# Patient Record
Sex: Female | Born: 1940
Health system: Southern US, Community
[De-identification: ages and names within clinical notes are randomized; demographics above are authoritative.]

## PROBLEM LIST (undated history)

## (undated) DIAGNOSIS — H409 Unspecified glaucoma: Secondary | ICD-10-CM

## (undated) DIAGNOSIS — I10 Essential (primary) hypertension: Secondary | ICD-10-CM

## (undated) DIAGNOSIS — N6310 Unspecified lump in the right breast, unspecified quadrant: Secondary | ICD-10-CM

## (undated) DIAGNOSIS — E785 Hyperlipidemia, unspecified: Secondary | ICD-10-CM

## (undated) HISTORY — PX: BREAST EXCISIONAL BIOPSY: SUR124

## (undated) HISTORY — PX: EYE SURGERY: SHX253

---

## 1997-09-16 ENCOUNTER — Ambulatory Visit (HOSPITAL_COMMUNITY): Admission: RE | Admit: 1997-09-16 | Discharge: 1997-09-16 | Payer: Self-pay | Admitting: Obstetrics and Gynecology

## 1998-01-18 ENCOUNTER — Ambulatory Visit (HOSPITAL_COMMUNITY): Admission: RE | Admit: 1998-01-18 | Discharge: 1998-01-18 | Payer: Self-pay | Admitting: Cardiovascular Disease

## 1998-02-16 ENCOUNTER — Other Ambulatory Visit: Admission: RE | Admit: 1998-02-16 | Discharge: 1998-02-16 | Payer: Self-pay | Admitting: *Deleted

## 1999-04-29 ENCOUNTER — Encounter: Payer: Self-pay | Admitting: Obstetrics and Gynecology

## 1999-04-29 ENCOUNTER — Encounter: Admission: RE | Admit: 1999-04-29 | Discharge: 1999-04-29 | Payer: Self-pay | Admitting: Obstetrics and Gynecology

## 1999-11-27 ENCOUNTER — Encounter: Payer: Self-pay | Admitting: Otolaryngology

## 1999-11-27 ENCOUNTER — Encounter: Admission: RE | Admit: 1999-11-27 | Discharge: 1999-11-27 | Payer: Self-pay | Admitting: Otolaryngology

## 2000-02-07 ENCOUNTER — Encounter: Payer: Self-pay | Admitting: Otolaryngology

## 2000-02-07 ENCOUNTER — Encounter: Admission: RE | Admit: 2000-02-07 | Discharge: 2000-02-07 | Payer: Self-pay | Admitting: Otolaryngology

## 2000-05-22 ENCOUNTER — Encounter: Payer: Self-pay | Admitting: Internal Medicine

## 2000-05-22 ENCOUNTER — Encounter: Admission: RE | Admit: 2000-05-22 | Discharge: 2000-05-22 | Payer: Self-pay | Admitting: Internal Medicine

## 2001-05-27 ENCOUNTER — Encounter: Payer: Self-pay | Admitting: Internal Medicine

## 2001-05-27 ENCOUNTER — Encounter: Admission: RE | Admit: 2001-05-27 | Discharge: 2001-05-27 | Payer: Self-pay | Admitting: Internal Medicine

## 2001-10-12 ENCOUNTER — Other Ambulatory Visit: Admission: RE | Admit: 2001-10-12 | Discharge: 2001-10-12 | Payer: Self-pay | Admitting: *Deleted

## 2002-06-30 ENCOUNTER — Encounter: Admission: RE | Admit: 2002-06-30 | Discharge: 2002-06-30 | Payer: Self-pay | Admitting: Internal Medicine

## 2002-06-30 ENCOUNTER — Encounter: Payer: Self-pay | Admitting: Internal Medicine

## 2002-07-13 ENCOUNTER — Other Ambulatory Visit: Admission: RE | Admit: 2002-07-13 | Discharge: 2002-07-13 | Payer: Self-pay | Admitting: Internal Medicine

## 2003-07-05 ENCOUNTER — Encounter: Admission: RE | Admit: 2003-07-05 | Discharge: 2003-07-05 | Payer: Self-pay | Admitting: Internal Medicine

## 2004-03-24 ENCOUNTER — Emergency Department (HOSPITAL_COMMUNITY): Admission: EM | Admit: 2004-03-24 | Discharge: 2004-03-24 | Payer: Self-pay | Admitting: Emergency Medicine

## 2004-06-05 ENCOUNTER — Ambulatory Visit (HOSPITAL_COMMUNITY): Admission: RE | Admit: 2004-06-05 | Discharge: 2004-06-05 | Payer: Self-pay | Admitting: Gastroenterology

## 2004-10-09 ENCOUNTER — Other Ambulatory Visit: Admission: RE | Admit: 2004-10-09 | Discharge: 2004-10-09 | Payer: Self-pay | Admitting: Internal Medicine

## 2005-08-04 ENCOUNTER — Encounter: Admission: RE | Admit: 2005-08-04 | Discharge: 2005-08-04 | Payer: Self-pay | Admitting: Internal Medicine

## 2005-10-05 ENCOUNTER — Emergency Department (HOSPITAL_COMMUNITY): Admission: EM | Admit: 2005-10-05 | Discharge: 2005-10-05 | Payer: Self-pay | Admitting: Emergency Medicine

## 2006-08-10 ENCOUNTER — Encounter: Admission: RE | Admit: 2006-08-10 | Discharge: 2006-08-10 | Payer: Self-pay | Admitting: Internal Medicine

## 2006-10-22 ENCOUNTER — Other Ambulatory Visit: Admission: RE | Admit: 2006-10-22 | Discharge: 2006-10-22 | Payer: Self-pay | Admitting: Internal Medicine

## 2008-02-11 ENCOUNTER — Encounter: Admission: RE | Admit: 2008-02-11 | Discharge: 2008-02-11 | Payer: Self-pay | Admitting: Internal Medicine

## 2008-10-17 ENCOUNTER — Other Ambulatory Visit: Admission: RE | Admit: 2008-10-17 | Discharge: 2008-10-17 | Payer: Self-pay | Admitting: Internal Medicine

## 2009-04-26 ENCOUNTER — Encounter: Admission: RE | Admit: 2009-04-26 | Discharge: 2009-04-26 | Payer: Self-pay | Admitting: Internal Medicine

## 2009-10-17 ENCOUNTER — Encounter: Admission: RE | Admit: 2009-10-17 | Discharge: 2009-10-17 | Payer: Self-pay | Admitting: Internal Medicine

## 2010-01-11 ENCOUNTER — Encounter (INDEPENDENT_AMBULATORY_CARE_PROVIDER_SITE_OTHER): Payer: Self-pay | Admitting: Internal Medicine

## 2010-01-11 ENCOUNTER — Ambulatory Visit: Payer: Self-pay | Admitting: Surgery

## 2010-01-11 ENCOUNTER — Ambulatory Visit (HOSPITAL_COMMUNITY): Admission: RE | Admit: 2010-01-11 | Discharge: 2010-01-11 | Payer: Self-pay | Admitting: Internal Medicine

## 2010-07-19 NOTE — Op Note (Signed)
NAME:  Tami Mercado, QAZI NO.:  [id number]   MEDICAL RECORD NO.:  [id number]          PATIENT TYPE:  AMB   LOCATION:  ENDO                         FACILITY:  [facility]   PHYSICIAN:  Danise Edge, M.D.   DATE OF BIRTH:  01/14/1943   DATE OF PROCEDURE:  06/05/2004  DATE OF DISCHARGE:                                 OPERATIVE REPORT   PROCEDURE INDICATION:  [name] is a 70 year old female, born  January 14, 1943. Ms. Galeno had an episode of hematochezia associated  with constipation. She is scheduled to undergo a diagnostic colonoscopy.   ENDOSCOPIST:  Danise Edge, M.D.   PREMEDICATION:  Versed 5 mg , Demerol 50 mg.   PROCEDURE:  After obtaining informed consent, [name] was placed in the  left lateral decubitus position. I administered intravenous Demerol and  intravenous Versed to achieve conscious sedation for the procedure. The  patient's blood pressure, oxygen saturation and cardiac rhythm were  monitored throughout the procedure and documented in the medical record.   Anal inspection and digital rectal exam were normal. The Olympus adjustable  pediatric colonoscope was introduced into the rectum and advanced to the  cecum. The appendiceal orifice and ileocecal valve were identified. The  ileocecal valve was intubated and the distal ileum inspected. Colonic  preparation for the exam today was excellent.   Rectum normal. There was not enough room to perform a retroflexed view of  the distal rectum. There appears to be moderate sized internal hemorrhoids  in the distal rectum.  Sigmoid colon and descending colon normal.  Splenic flexure normal.  Transverse colon normal.  Hepatic flexure normal.  Ascending colon normal.  The cecum and ileocecal valve normal.  Distal ileum normal.   ASSESSMENT:  Normal diagnostic proctocolonoscopy to the cecum with  inspection of the distal ileum.      MJ/MEDQ  D:  06/05/2004  T:  06/05/2004  Job:   [id number]   cc:   Georgann Housekeeper, MD  [address]  [place]  [place] [address]  Fax: [phone]

## 2010-12-25 ENCOUNTER — Other Ambulatory Visit: Payer: Self-pay | Admitting: Internal Medicine

## 2010-12-25 DIAGNOSIS — Z1231 Encounter for screening mammogram for malignant neoplasm of breast: Secondary | ICD-10-CM

## 2010-12-30 ENCOUNTER — Ambulatory Visit
Admission: RE | Admit: 2010-12-30 | Discharge: 2010-12-30 | Disposition: A | Discharge status: Home / Self Care | Payer: BC Managed Care – PPO | Source: Ambulatory Visit | Attending: Internal Medicine | Admitting: Internal Medicine

## 2010-12-30 DIAGNOSIS — Z1231 Encounter for screening mammogram for malignant neoplasm of breast: Secondary | ICD-10-CM

## 2011-01-02 ENCOUNTER — Ambulatory Visit: Payer: Self-pay

## 2011-01-08 ENCOUNTER — Other Ambulatory Visit (HOSPITAL_COMMUNITY)
Admission: RE | Admit: 2011-01-08 | Discharge: 2011-01-08 | Disposition: A | Discharge status: Home / Self Care | Payer: BC Managed Care – PPO | Source: Ambulatory Visit | Attending: Obstetrics and Gynecology | Admitting: Obstetrics and Gynecology

## 2011-01-08 ENCOUNTER — Other Ambulatory Visit: Payer: Self-pay | Admitting: Obstetrics and Gynecology

## 2011-01-08 DIAGNOSIS — Z01419 Encounter for gynecological examination (general) (routine) without abnormal findings: Secondary | ICD-10-CM | POA: Insufficient documentation

## 2012-02-09 ENCOUNTER — Other Ambulatory Visit: Payer: Self-pay | Admitting: Internal Medicine

## 2012-02-09 DIAGNOSIS — Z1231 Encounter for screening mammogram for malignant neoplasm of breast: Secondary | ICD-10-CM

## 2012-03-17 ENCOUNTER — Ambulatory Visit
Admission: RE | Admit: 2012-03-17 | Discharge: 2012-03-17 | Disposition: A | Discharge status: Home / Self Care | Payer: BC Managed Care – PPO | Source: Ambulatory Visit | Attending: Internal Medicine | Admitting: Internal Medicine

## 2012-03-17 DIAGNOSIS — Z1231 Encounter for screening mammogram for malignant neoplasm of breast: Secondary | ICD-10-CM

## 2013-05-24 ENCOUNTER — Other Ambulatory Visit: Payer: Self-pay

## 2013-05-24 DIAGNOSIS — Z1231 Encounter for screening mammogram for malignant neoplasm of breast: Secondary | ICD-10-CM

## 2013-06-10 ENCOUNTER — Ambulatory Visit: Payer: BC Managed Care – PPO

## 2013-06-28 ENCOUNTER — Encounter (INDEPENDENT_AMBULATORY_CARE_PROVIDER_SITE_OTHER): Payer: Self-pay

## 2013-06-28 ENCOUNTER — Ambulatory Visit
Admission: RE | Admit: 2013-06-28 | Discharge: 2013-06-28 | Disposition: A | Discharge status: Home / Self Care | Payer: BC Managed Care – PPO | Source: Ambulatory Visit

## 2013-06-28 DIAGNOSIS — Z1231 Encounter for screening mammogram for malignant neoplasm of breast: Secondary | ICD-10-CM

## 2013-07-05 ENCOUNTER — Ambulatory Visit (HOSPITAL_COMMUNITY): Payer: BC Managed Care – PPO | Attending: Cardiology | Admitting: Cardiology

## 2013-07-05 ENCOUNTER — Other Ambulatory Visit (HOSPITAL_COMMUNITY): Payer: Self-pay | Admitting: Internal Medicine

## 2013-07-05 DIAGNOSIS — R011 Cardiac murmur, unspecified: Secondary | ICD-10-CM | POA: Insufficient documentation

## 2013-07-05 DIAGNOSIS — E785 Hyperlipidemia, unspecified: Secondary | ICD-10-CM | POA: Insufficient documentation

## 2013-07-05 DIAGNOSIS — I10 Essential (primary) hypertension: Secondary | ICD-10-CM | POA: Insufficient documentation

## 2013-07-05 NOTE — Progress Notes (Signed)
Echo performed. 

## 2015-02-07 ENCOUNTER — Other Ambulatory Visit: Payer: Self-pay

## 2015-02-07 DIAGNOSIS — Z1231 Encounter for screening mammogram for malignant neoplasm of breast: Secondary | ICD-10-CM

## 2015-02-15 ENCOUNTER — Other Ambulatory Visit: Payer: Self-pay | Admitting: Gastroenterology

## 2015-02-15 ENCOUNTER — Encounter (HOSPITAL_COMMUNITY): Payer: Self-pay | Admitting: *Deleted

## 2015-03-01 ENCOUNTER — Encounter (HOSPITAL_COMMUNITY): Payer: Self-pay

## 2015-03-01 ENCOUNTER — Ambulatory Visit (HOSPITAL_COMMUNITY)
Admission: RE | Admit: 2015-03-01 | Discharge: 2015-03-01 | Disposition: A | Discharge status: Home / Self Care | Payer: BLUE CROSS/BLUE SHIELD | Source: Ambulatory Visit | Attending: Gastroenterology | Admitting: Gastroenterology

## 2015-03-01 ENCOUNTER — Ambulatory Visit (HOSPITAL_COMMUNITY): Payer: BLUE CROSS/BLUE SHIELD | Admitting: Anesthesiology

## 2015-03-01 ENCOUNTER — Encounter (HOSPITAL_COMMUNITY)
Admission: RE | Disposition: A | Discharge status: Home / Self Care | Payer: Self-pay | Source: Ambulatory Visit | Attending: Gastroenterology

## 2015-03-01 DIAGNOSIS — K635 Polyp of colon: Secondary | ICD-10-CM | POA: Diagnosis not present

## 2015-03-01 DIAGNOSIS — I340 Nonrheumatic mitral (valve) insufficiency: Secondary | ICD-10-CM | POA: Insufficient documentation

## 2015-03-01 DIAGNOSIS — I10 Essential (primary) hypertension: Secondary | ICD-10-CM | POA: Diagnosis not present

## 2015-03-01 DIAGNOSIS — E7800 Pure hypercholesterolemia, unspecified: Secondary | ICD-10-CM | POA: Diagnosis not present

## 2015-03-01 DIAGNOSIS — Z1211 Encounter for screening for malignant neoplasm of colon: Secondary | ICD-10-CM | POA: Diagnosis present

## 2015-03-01 DIAGNOSIS — H409 Unspecified glaucoma: Secondary | ICD-10-CM | POA: Diagnosis not present

## 2015-03-01 DIAGNOSIS — M1990 Unspecified osteoarthritis, unspecified site: Secondary | ICD-10-CM | POA: Diagnosis not present

## 2015-03-01 HISTORY — PX: COLONOSCOPY WITH PROPOFOL: SHX5780

## 2015-03-01 HISTORY — DX: Essential (primary) hypertension: I10

## 2015-03-01 SURGERY — COLONOSCOPY WITH PROPOFOL
Anesthesia: Monitor Anesthesia Care

## 2015-03-01 MED ORDER — LACTATED RINGERS IV SOLN
INTRAVENOUS | Status: DC
  Administered 2015-03-01: 1000 mL via INTRAVENOUS

## 2015-03-01 MED ORDER — PROPOFOL 10 MG/ML IV BOLUS
INTRAVENOUS | Status: DC | PRN
  Administered 2015-03-01: 100 mg via INTRAVENOUS
  Administered 2015-03-01: 50 mg via INTRAVENOUS

## 2015-03-01 MED ORDER — SODIUM CHLORIDE 0.9 % IV SOLN
INTRAVENOUS | Status: DC
Start: 2015-03-01 — End: 2015-03-01

## 2015-03-01 MED ORDER — PROPOFOL 10 MG/ML IV BOLUS
INTRAVENOUS | Status: AC
  Filled 2015-03-01: qty 20

## 2015-03-01 SURGICAL SUPPLY — 22 items

## 2015-03-01 NOTE — Anesthesia Postprocedure Evaluation (Signed)
Anesthesia Post Note  Patient: Tami DownsDiane C Mercado  Procedure(s) Performed: Procedure(s) (LRB): COLONOSCOPY WITH PROPOFOL (N/A)  Patient location during evaluation: Endoscopy Anesthesia Type: MAC Level of consciousness: awake and alert Pain management: pain level controlled Vital Signs Assessment: post-procedure vital signs reviewed and stable Respiratory status: spontaneous breathing, nonlabored ventilation, respiratory function stable and patient connected to nasal cannula oxygen Cardiovascular status: stable and blood pressure returned to baseline Anesthetic complications: no    Last Vitals:  Filed Vitals:   03/01/15 1240 03/01/15 1250  BP: 133/52 131/47  Pulse: 66 62  Temp:    Resp: 18 20    Last Pain: There were no vitals filed for this visit.               Phillips Groutarignan, Archibald Marchetta

## 2015-03-01 NOTE — Discharge Instructions (Signed)
Colonoscopy, Care After °Refer to this sheet in the next few weeks. These instructions provide you with information on caring for yourself after your procedure. Your health care provider may also give you more specific instructions. Your treatment has been planned according to current medical practices, but problems sometimes occur. Call your health care provider if you have any problems or questions after your procedure. °WHAT TO EXPECT AFTER THE PROCEDURE  °After your procedure, it is typical to have the following: °· A small amount of blood in your stool. °· Moderate amounts of gas and mild abdominal cramping or bloating. °HOME CARE INSTRUCTIONS °· Do not drive, operate machinery, or sign important documents for 24 hours. °· You may shower and resume your regular physical activities, but move at a slower pace for the first 24 hours. °· Take frequent rest periods for the first 24 hours. °· Walk around or put a warm pack on your abdomen to help reduce abdominal cramping and bloating. °· Drink enough fluids to keep your urine clear or pale yellow. °· You may resume your normal diet as instructed by your health care provider. Avoid heavy or fried foods that are hard to digest. °· Avoid drinking alcohol for 24 hours or as instructed by your health care provider. °· Only take over-the-counter or prescription medicines as directed by your health care provider. °SEEK MEDICAL CARE IF: °You have persistent spotting of blood in your stool 2-3 days after the procedure. °SEEK IMMEDIATE MEDICAL CARE IF: °· You have more than a small spotting of blood in your stool. °· You pass large blood clots in your stool. °· Your abdomen is swollen (distended). °· You have nausea or vomiting. °· You have a fever. °· You have increasing abdominal pain that is not relieved with medicine. °  °This information is not intended to replace advice given to you by your health care provider. Make sure you discuss any questions you have with your  health care provider. °  °Document Released: 10/02/2003 Document Revised: 12/08/2012 Document Reviewed: 10/25/2012 °Elsevier Interactive Patient Education ©2016 [employer] ° °

## 2015-03-01 NOTE — Anesthesia Preprocedure Evaluation (Signed)
Anesthesia Evaluation  Patient identified by MRN, date of birth, ID band Patient awake    Reviewed: Allergy & Precautions, NPO status , Patient's Chart, lab work & pertinent test results  Airway Mallampati: II  TM Distance: >3 FB Neck ROM: Full    Dental no notable dental hx.    Pulmonary neg pulmonary ROS,    Pulmonary exam normal breath sounds clear to auscultation       Cardiovascular hypertension, Pt. on medications Normal cardiovascular exam Rhythm:Regular Rate:Normal     Neuro/Psych negative neurological ROS  negative psych ROS   GI/Hepatic negative GI ROS, Neg liver ROS,   Endo/Other  negative endocrine ROS  Renal/GU negative Renal ROS  negative genitourinary   Musculoskeletal negative musculoskeletal ROS (+)   Abdominal   Peds negative pediatric ROS (+)  Hematology negative hematology ROS (+)   Anesthesia Other Findings   Reproductive/Obstetrics negative OB ROS                             Anesthesia Physical Anesthesia Plan  ASA: II  Anesthesia Plan: MAC   Post-op Pain Management:    Induction:   Airway Management Planned: Simple Face Mask  Additional Equipment:   Intra-op Plan:   Post-operative Plan:   Informed Consent: I have reviewed the patients History and Physical, chart, labs and discussed the procedure including the risks, benefits and alternatives for the proposed anesthesia with the patient or authorized representative who has indicated his/her understanding and acceptance.   Dental advisory given  Plan Discussed with: CRNA  Anesthesia Plan Comments:         Anesthesia Quick Evaluation  

## 2015-03-01 NOTE — Transfer of Care (Signed)
Immediate Anesthesia Transfer of Care Note  Patient: Tami Mercado  Procedure(s) Performed: Procedure(s): COLONOSCOPY WITH PROPOFOL (N/A)  Patient Location: PACU  Anesthesia Type:MAC  Level of Consciousness: awake, alert , oriented and patient cooperative  Airway & Oxygen Therapy: Patient Spontanous Breathing  Post-op Assessment: Report given to RN, Post -op Vital signs reviewed and stable and Patient moving all extremities X 4  Post vital signs: Reviewed and stable  Last Vitals:  Filed Vitals:   03/01/15 1111  BP: 146/62  Pulse: 69  Temp: 36.6 C  Resp: 21    Complications: No apparent anesthesia complications

## 2015-03-01 NOTE — Op Note (Signed)
Procedure: Screening colonoscopy. Normal screening colonoscopy performed on 06/05/2004  Endoscopist: Danise EdgeMartin Kinsey Karch  Premedication: Propofol administered by anesthesia  Procedure: The patient was placed in the left lateral decubitus position. Anal inspection and digital rectal exam were normal. The Pentax pediatric colonoscope was introduced into the rectum and advanced to the cecum. A normal-appearing appendiceal orifice was identified. A normal-appearing ileocecal valve was intubated and the terminal ileum inspected. Colonic preparation exam today was good. Withdrawal time was 10 minutes  Rectum. Normal. Retroflexed view of the distal rectum was normal  Sigmoid colon and descending colon. From the distal sigmoid colon, a 5 mm sessile polyp was removed with the cold snare  Splenic flexure. Normal  Transverse colon. Normal  Hepatic flexure. Normal  Ascending colon. Normal  Cecum and ileocecal valve. Normal  Terminal ileum. Normal.  Assessment: A small polyp was removed from the distal sigmoid colon. Otherwise normal colonoscopy  Recommendation: I do not recommend performing repeat surveillance colonoscopy

## 2015-03-02 ENCOUNTER — Encounter (HOSPITAL_COMMUNITY): Payer: Self-pay | Admitting: Gastroenterology

## 2015-03-14 ENCOUNTER — Ambulatory Visit
Admission: RE | Admit: 2015-03-14 | Discharge: 2015-03-14 | Disposition: A | Discharge status: Home / Self Care | Payer: BLUE CROSS/BLUE SHIELD | Source: Ambulatory Visit

## 2015-03-14 DIAGNOSIS — Z1231 Encounter for screening mammogram for malignant neoplasm of breast: Secondary | ICD-10-CM

## 2015-05-01 NOTE — H&P (Signed)
Procedure: Screening colonoscopy. Normal screening colonoscopy performed on 06/05/2004  History: The patient is a 75 year old female born 01/14/1943. She is scheduled to undergo a screening colonoscopy today.  Past medical history: Hypercholesterolemia. Hypertension. Osteoporosis. Osteoarthritis. Gastroesophageal reflux. Glaucoma. Mitral valve regurgitation. Chronic anxiety. Right shoulder arthroscopy. Left eye cataract surgery. Right foot surgery.  Medication allergies: Fosamax. Lisinopril. Dicyclomine.  Exam: The patient is alert and lying comfortably on the endoscopy stretcher. Abdomen is soft and nontender to palpation. Lungs are clear to auscultation. Cardiac exam reveals a regular rhythm.  Plan: Proceed with screening colonoscopy

## 2016-01-30 ENCOUNTER — Encounter: Payer: Self-pay | Admitting: Podiatry

## 2016-01-30 ENCOUNTER — Ambulatory Visit (INDEPENDENT_AMBULATORY_CARE_PROVIDER_SITE_OTHER): Payer: BLUE CROSS/BLUE SHIELD | Admitting: Podiatry

## 2016-01-30 ENCOUNTER — Ambulatory Visit (INDEPENDENT_AMBULATORY_CARE_PROVIDER_SITE_OTHER): Payer: BLUE CROSS/BLUE SHIELD

## 2016-01-30 DIAGNOSIS — L84 Corns and callosities: Secondary | ICD-10-CM

## 2016-01-30 DIAGNOSIS — M79671 Pain in right foot: Secondary | ICD-10-CM | POA: Diagnosis not present

## 2016-01-30 DIAGNOSIS — M779 Enthesopathy, unspecified: Secondary | ICD-10-CM

## 2016-01-30 DIAGNOSIS — M2041 Other hammer toe(s) (acquired), right foot: Secondary | ICD-10-CM

## 2016-01-30 MED ORDER — TRIAMCINOLONE ACETONIDE 10 MG/ML IJ SUSP
10.0000 mg | Freq: Once | INTRAMUSCULAR | Status: AC
  Administered 2016-01-30: 10 mg

## 2016-01-30 NOTE — Progress Notes (Signed)
   Subjective:    Patient ID: Tami Mercado Surgeon, female    DOB: 01/14/1943, 75 y.o.   MRN: [id number]  HPI Chief Complaint  Patient presents with  . Callouses    Right foot; plantar forefoot; x3 weeks      Review of Systems  All other systems reviewed and are negative.      Objective:   Physical Exam        Assessment & Plan:

## 2016-01-31 NOTE — Progress Notes (Signed)
Subjective:     Patient ID: Tami Mercado, female   DOB: 01/14/1943, 75 y.o.   MRN: [id number]  HPI patient presents with painful callus underneath the right second metatarsal with large keratotic lesion formation and states it's been occurring for about 2 months with mild movement of the second toe also noted   Review of Systems  All other systems reviewed and are negative.      Objective:   Physical Exam  Constitutional: She is oriented to person, place, and time.  Cardiovascular: Intact distal pulses.   Musculoskeletal: Normal range of motion.  Neurological: She is oriented to person, place, and time.  Skin: Skin is warm.  Nursing note and vitals reviewed.  neurovascular status intact muscle strength adequate range of motion within normal limits with patient found to have discomfort and fluid buildup around the second MPJ right with mild medial and slight dorsal dislocation second digit with keratotic lesion formation that is around the second metatarsal head right. Patient's found to have good digital perfusion and is well oriented 3     Assessment:     Probability for mild flexor plate stretch with movement of the second toe with inflammatory capsulitis second MPJ and keratotic lesion secondary to pressure against the metatarsal    Plan:     H&P x-rays reviewed and today I did a proximal nerve block explained risk of injection and carefully aspirated the second MPJ getting out of small amount of clear fluid. Then injected the joint with a quarter cc dexamethasone Kenalog and debrided lesion to take pressure off and reappoint 2 weeks and advised on not going barefoot and wearing rigid bottom shoes. Reappoint at that time  X-ray indicates mild drift of the second digit right and a medial direction with no indications of arthritis or stress fracture

## 2016-03-14 ENCOUNTER — Other Ambulatory Visit: Payer: Self-pay | Admitting: Internal Medicine

## 2016-03-14 DIAGNOSIS — Z1231 Encounter for screening mammogram for malignant neoplasm of breast: Secondary | ICD-10-CM

## 2016-04-04 ENCOUNTER — Ambulatory Visit
Admission: RE | Admit: 2016-04-04 | Discharge: 2016-04-04 | Disposition: A | Discharge status: Home / Self Care | Payer: BLUE CROSS/BLUE SHIELD | Source: Ambulatory Visit | Attending: Internal Medicine | Admitting: Internal Medicine

## 2016-04-04 DIAGNOSIS — Z1231 Encounter for screening mammogram for malignant neoplasm of breast: Secondary | ICD-10-CM

## 2016-04-07 ENCOUNTER — Other Ambulatory Visit: Payer: Self-pay | Admitting: Internal Medicine

## 2016-04-07 DIAGNOSIS — R928 Other abnormal and inconclusive findings on diagnostic imaging of breast: Secondary | ICD-10-CM

## 2016-04-11 ENCOUNTER — Ambulatory Visit
Admission: RE | Admit: 2016-04-11 | Discharge: 2016-04-11 | Disposition: A | Discharge status: Home / Self Care | Payer: BLUE CROSS/BLUE SHIELD | Source: Ambulatory Visit | Attending: Internal Medicine | Admitting: Internal Medicine

## 2016-04-11 ENCOUNTER — Other Ambulatory Visit: Payer: Self-pay | Admitting: Internal Medicine

## 2016-04-11 DIAGNOSIS — R928 Other abnormal and inconclusive findings on diagnostic imaging of breast: Secondary | ICD-10-CM

## 2016-04-11 DIAGNOSIS — N6489 Other specified disorders of breast: Secondary | ICD-10-CM

## 2016-04-16 ENCOUNTER — Ambulatory Visit
Admission: RE | Admit: 2016-04-16 | Discharge: 2016-04-16 | Disposition: A | Discharge status: Home / Self Care | Payer: BLUE CROSS/BLUE SHIELD | Source: Ambulatory Visit | Attending: Internal Medicine | Admitting: Internal Medicine

## 2016-04-16 ENCOUNTER — Other Ambulatory Visit: Payer: Self-pay | Admitting: Internal Medicine

## 2016-04-16 DIAGNOSIS — R928 Other abnormal and inconclusive findings on diagnostic imaging of breast: Secondary | ICD-10-CM

## 2016-04-16 DIAGNOSIS — N6489 Other specified disorders of breast: Secondary | ICD-10-CM

## 2016-04-30 ENCOUNTER — Ambulatory Visit: Payer: Self-pay | Admitting: General Surgery

## 2016-04-30 DIAGNOSIS — N6091 Unspecified benign mammary dysplasia of right breast: Secondary | ICD-10-CM

## 2016-05-16 ENCOUNTER — Other Ambulatory Visit: Payer: Self-pay | Admitting: General Surgery

## 2016-05-16 DIAGNOSIS — N6091 Unspecified benign mammary dysplasia of right breast: Secondary | ICD-10-CM

## 2016-06-04 ENCOUNTER — Encounter (HOSPITAL_BASED_OUTPATIENT_CLINIC_OR_DEPARTMENT_OTHER): Payer: Self-pay | Admitting: *Deleted

## 2016-06-05 ENCOUNTER — Encounter (HOSPITAL_BASED_OUTPATIENT_CLINIC_OR_DEPARTMENT_OTHER)
Admission: RE | Admit: 2016-06-05 | Discharge: 2016-06-05 | Disposition: A | Discharge status: Home / Self Care | Payer: BLUE CROSS/BLUE SHIELD | Source: Ambulatory Visit | Attending: General Surgery | Admitting: General Surgery

## 2016-06-05 DIAGNOSIS — Z01818 Encounter for other preprocedural examination: Secondary | ICD-10-CM | POA: Diagnosis present

## 2016-06-05 LAB — BASIC METABOLIC PANEL
ANION GAP: 5 (ref 5–15)
BUN: 21 mg/dL — ABNORMAL HIGH (ref 6–20)
CHLORIDE: 106 mmol/L (ref 101–111)
CO2: 31 mmol/L (ref 22–32)
Calcium: 9.3 mg/dL (ref 8.9–10.3)
Creatinine, Ser: 0.7 mg/dL (ref 0.44–1.00)
GFR calc non Af Amer: >60 mL/min (ref >60)
GLUCOSE: 162 mg/dL — AB (ref 65–99)
POTASSIUM: 4.9 mmol/L (ref 3.5–5.1)
Sodium: 142 mmol/L (ref 135–145)

## 2016-06-05 NOTE — Progress Notes (Addendum)
Boost drink given with instructions to complete by [facility], pt verbalized understanding.  EKG reviewed by Dr. Maple Hudson, will proceed with surgery as scheduled.

## 2016-06-06 NOTE — Progress Notes (Signed)
Reviewed glucose (162) result with Dr Hyacinth Meeker (anesthesia) who said OK to proceed with scheduled surgery.

## 2016-06-09 ENCOUNTER — Ambulatory Visit
Admission: RE | Admit: 2016-06-09 | Discharge: 2016-06-09 | Disposition: A | Discharge status: Home / Self Care | Payer: BLUE CROSS/BLUE SHIELD | Source: Ambulatory Visit | Attending: General Surgery | Admitting: General Surgery

## 2016-06-09 DIAGNOSIS — N6091 Unspecified benign mammary dysplasia of right breast: Secondary | ICD-10-CM

## 2016-06-11 ENCOUNTER — Ambulatory Visit
Admission: RE | Admit: 2016-06-11 | Discharge: 2016-06-11 | Disposition: A | Discharge status: Home / Self Care | Payer: BLUE CROSS/BLUE SHIELD | Source: Ambulatory Visit | Attending: General Surgery | Admitting: General Surgery

## 2016-06-11 ENCOUNTER — Ambulatory Visit (HOSPITAL_BASED_OUTPATIENT_CLINIC_OR_DEPARTMENT_OTHER)
Admission: RE | Admit: 2016-06-11 | Discharge: 2016-06-11 | Disposition: A | Discharge status: Home / Self Care | Payer: BLUE CROSS/BLUE SHIELD | Source: Ambulatory Visit | Attending: General Surgery | Admitting: General Surgery

## 2016-06-11 ENCOUNTER — Encounter (HOSPITAL_BASED_OUTPATIENT_CLINIC_OR_DEPARTMENT_OTHER)
Admission: RE | Disposition: A | Discharge status: Home / Self Care | Payer: Self-pay | Source: Ambulatory Visit | Attending: General Surgery

## 2016-06-11 ENCOUNTER — Ambulatory Visit (HOSPITAL_BASED_OUTPATIENT_CLINIC_OR_DEPARTMENT_OTHER): Payer: BLUE CROSS/BLUE SHIELD | Admitting: Anesthesiology

## 2016-06-11 ENCOUNTER — Encounter (HOSPITAL_BASED_OUTPATIENT_CLINIC_OR_DEPARTMENT_OTHER): Payer: Self-pay | Admitting: *Deleted

## 2016-06-11 DIAGNOSIS — D0501 Lobular carcinoma in situ of right breast: Secondary | ICD-10-CM | POA: Insufficient documentation

## 2016-06-11 DIAGNOSIS — N6021 Fibroadenosis of right breast: Secondary | ICD-10-CM | POA: Insufficient documentation

## 2016-06-11 DIAGNOSIS — Z79899 Other long term (current) drug therapy: Secondary | ICD-10-CM | POA: Diagnosis not present

## 2016-06-11 DIAGNOSIS — N6091 Unspecified benign mammary dysplasia of right breast: Secondary | ICD-10-CM | POA: Diagnosis present

## 2016-06-11 DIAGNOSIS — I10 Essential (primary) hypertension: Secondary | ICD-10-CM | POA: Diagnosis not present

## 2016-06-11 DIAGNOSIS — N6489 Other specified disorders of breast: Secondary | ICD-10-CM | POA: Insufficient documentation

## 2016-06-11 HISTORY — DX: Unspecified glaucoma: H40.9

## 2016-06-11 HISTORY — DX: Hyperlipidemia, unspecified: E78.5

## 2016-06-11 HISTORY — DX: Unspecified lump in the right breast, unspecified quadrant: N63.10

## 2016-06-11 HISTORY — PX: BREAST LUMPECTOMY WITH RADIOACTIVE SEED LOCALIZATION: SHX6424

## 2016-06-11 SURGERY — BREAST LUMPECTOMY WITH RADIOACTIVE SEED LOCALIZATION
Anesthesia: General | Site: Breast | Laterality: Right

## 2016-06-11 MED ORDER — ONDANSETRON HCL 4 MG/2ML IJ SOLN
INTRAMUSCULAR | Status: AC
  Filled 2016-06-11: qty 2

## 2016-06-11 MED ORDER — ONDANSETRON HCL 4 MG/2ML IJ SOLN
INTRAMUSCULAR | Status: DC | PRN
  Administered 2016-06-11: 4 mg via INTRAVENOUS

## 2016-06-11 MED ORDER — GABAPENTIN 300 MG PO CAPS
ORAL_CAPSULE | ORAL | Status: AC
  Filled 2016-06-11: qty 1

## 2016-06-11 MED ORDER — SODIUM CHLORIDE 0.9 % IJ SOLN
INTRAMUSCULAR | Status: AC
  Filled 2016-06-11: qty 10

## 2016-06-11 MED ORDER — EPHEDRINE 5 MG/ML INJ
INTRAVENOUS | Status: AC
  Filled 2016-06-11: qty 10

## 2016-06-11 MED ORDER — ESMOLOL HCL 100 MG/10ML IV SOLN
INTRAVENOUS | Status: AC
  Filled 2016-06-11: qty 10

## 2016-06-11 MED ORDER — CHLORHEXIDINE GLUCONATE CLOTH 2 % EX PADS: 6.0000 | MEDICATED_PAD | Freq: Once | CUTANEOUS | Status: DC

## 2016-06-11 MED ORDER — EPHEDRINE SULFATE 50 MG/ML IJ SOLN
INTRAMUSCULAR | Status: DC | PRN
  Administered 2016-06-11 (×2): 10 mg via INTRAVENOUS

## 2016-06-11 MED ORDER — METHYLENE BLUE 0.5 % INJ SOLN
INTRAVENOUS | Status: AC
  Filled 2016-06-11: qty 10

## 2016-06-11 MED ORDER — ACETAMINOPHEN 500 MG PO TABS
1000.0000 mg | ORAL_TABLET | ORAL | Status: AC
  Administered 2016-06-11: 1000 mg via ORAL

## 2016-06-11 MED ORDER — PROPOFOL 10 MG/ML IV BOLUS
INTRAVENOUS | Status: DC | PRN
  Administered 2016-06-11: 120 mg via INTRAVENOUS

## 2016-06-11 MED ORDER — HYDROCODONE-ACETAMINOPHEN 5-325 MG PO TABS: 1.0000 | ORAL_TABLET | ORAL | 0 refills | Status: DC | PRN

## 2016-06-11 MED ORDER — ONDANSETRON HCL 4 MG/2ML IJ SOLN: 4.0000 mg | Freq: Four times a day (QID) | INTRAMUSCULAR | Status: DC | PRN

## 2016-06-11 MED ORDER — DEXAMETHASONE SODIUM PHOSPHATE 4 MG/ML IJ SOLN
INTRAMUSCULAR | Status: DC | PRN
  Administered 2016-06-11: 10 mg via INTRAVENOUS

## 2016-06-11 MED ORDER — DEXAMETHASONE SODIUM PHOSPHATE 10 MG/ML IJ SOLN
INTRAMUSCULAR | Status: AC
  Filled 2016-06-11: qty 1

## 2016-06-11 MED ORDER — ACETAMINOPHEN 500 MG PO TABS
ORAL_TABLET | ORAL | Status: AC
  Filled 2016-06-11: qty 2

## 2016-06-11 MED ORDER — FENTANYL CITRATE (PF) 100 MCG/2ML IJ SOLN: 25.0000 ug | INTRAMUSCULAR | Status: DC | PRN

## 2016-06-11 MED ORDER — OXYCODONE HCL 5 MG PO TABS: 5.0000 mg | ORAL_TABLET | Freq: Once | ORAL | Status: DC | PRN

## 2016-06-11 MED ORDER — VANCOMYCIN HCL IN DEXTROSE 1-5 GM/200ML-% IV SOLN
INTRAVENOUS | Status: AC
  Filled 2016-06-11: qty 200

## 2016-06-11 MED ORDER — FENTANYL CITRATE (PF) 100 MCG/2ML IJ SOLN
INTRAMUSCULAR | Status: AC
  Filled 2016-06-11: qty 2

## 2016-06-11 MED ORDER — CELECOXIB 200 MG PO CAPS
ORAL_CAPSULE | ORAL | Status: AC
  Filled 2016-06-11: qty 2

## 2016-06-11 MED ORDER — GLYCOPYRROLATE 0.2 MG/ML IJ SOLN
INTRAMUSCULAR | Status: DC | PRN
  Administered 2016-06-11: 0.1 mg via INTRAVENOUS

## 2016-06-11 MED ORDER — LIDOCAINE HCL (CARDIAC) 20 MG/ML IV SOLN
INTRAVENOUS | Status: DC | PRN
  Administered 2016-06-11: 60 mg via INTRAVENOUS

## 2016-06-11 MED ORDER — GABAPENTIN 300 MG PO CAPS
300.0000 mg | ORAL_CAPSULE | ORAL | Status: AC
  Administered 2016-06-11: 300 mg via ORAL

## 2016-06-11 MED ORDER — MIDAZOLAM HCL 2 MG/2ML IJ SOLN: 1.0000 mg | INTRAMUSCULAR | Status: DC | PRN

## 2016-06-11 MED ORDER — PROPOFOL 500 MG/50ML IV EMUL
INTRAVENOUS | Status: AC
  Filled 2016-06-11: qty 50

## 2016-06-11 MED ORDER — ONDANSETRON HCL 4 MG/2ML IJ SOLN
INTRAMUSCULAR | Status: AC
  Filled 2016-06-11: qty 4

## 2016-06-11 MED ORDER — VANCOMYCIN HCL IN DEXTROSE 1-5 GM/200ML-% IV SOLN
1000.0000 mg | INTRAVENOUS | Status: AC
  Administered 2016-06-11: 1000 mg via INTRAVENOUS

## 2016-06-11 MED ORDER — BUPIVACAINE HCL (PF) 0.5 % IJ SOLN
INTRAMUSCULAR | Status: DC | PRN
  Administered 2016-06-11: 23 mL

## 2016-06-11 MED ORDER — BUPIVACAINE HCL (PF) 0.5 % IJ SOLN
INTRAMUSCULAR | Status: AC
  Filled 2016-06-11: qty 60

## 2016-06-11 MED ORDER — CELECOXIB 400 MG PO CAPS
400.0000 mg | ORAL_CAPSULE | ORAL | Status: AC
  Administered 2016-06-11: 400 mg via ORAL

## 2016-06-11 MED ORDER — LIDOCAINE 2% (20 MG/ML) 5 ML SYRINGE
INTRAMUSCULAR | Status: AC
  Filled 2016-06-11: qty 5

## 2016-06-11 MED ORDER — FENTANYL CITRATE (PF) 100 MCG/2ML IJ SOLN
50.0000 ug | INTRAMUSCULAR | Status: DC | PRN
  Administered 2016-06-11: 50 ug via INTRAVENOUS
  Administered 2016-06-11: 25 ug via INTRAVENOUS

## 2016-06-11 MED ORDER — LACTATED RINGERS IV SOLN
INTRAVENOUS | Status: DC
  Administered 2016-06-11 (×2): via INTRAVENOUS

## 2016-06-11 MED ORDER — OXYCODONE HCL 5 MG/5ML PO SOLN: 5.0000 mg | Freq: Once | ORAL | Status: DC | PRN

## 2016-06-11 MED ORDER — SCOPOLAMINE 1 MG/3DAYS TD PT72: 1.0000 | MEDICATED_PATCH | Freq: Once | TRANSDERMAL | Status: DC | PRN

## 2016-06-11 SURGICAL SUPPLY — 39 items
ADH SKN CLS APL DERMABOND .7 (GAUZE/BANDAGES/DRESSINGS) ×1
BLADE SURG 15 STRL LF DISP TIS (BLADE) ×1 IMPLANT
BLADE SURG 15 STRL SS (BLADE) ×2
CANISTER SUCT 1200ML W/VALVE (MISCELLANEOUS) ×2 IMPLANT
CHLORAPREP W/TINT 26ML (MISCELLANEOUS) ×2 IMPLANT
COVER BACK TABLE 60X90IN (DRAPES) ×2 IMPLANT
COVER MAYO STAND STRL (DRAPES) ×2 IMPLANT
COVER PROBE W GEL 5X96 (DRAPES) ×2 IMPLANT
DERMABOND ADVANCED (GAUZE/BANDAGES/DRESSINGS) ×1
DERMABOND ADVANCED .7 DNX12 (GAUZE/BANDAGES/DRESSINGS) ×1 IMPLANT
DEVICE DUBIN W/COMP PLATE 8390 (MISCELLANEOUS) ×2 IMPLANT
DRAPE LAPAROSCOPIC ABDOMINAL (DRAPES) ×2 IMPLANT
DRAPE UTILITY XL STRL (DRAPES) ×2 IMPLANT
ELECT COATED BLADE 2.86 ST (ELECTRODE) ×2 IMPLANT
ELECT REM PT RETURN 9FT ADLT (ELECTROSURGICAL) ×2
ELECTRODE REM PT RTRN 9FT ADLT (ELECTROSURGICAL) ×1 IMPLANT
GLOVE BIO SURGEON STRL SZ7.5 (GLOVE) ×4 IMPLANT
GLOVE BIOGEL PI IND STRL 7.5 (GLOVE) IMPLANT
GLOVE BIOGEL PI INDICATOR 7.5 (GLOVE) ×1
GLOVE SURG SS PI 7.5 STRL IVOR (GLOVE) ×1 IMPLANT
GOWN STRL REUS W/ TWL LRG LVL3 (GOWN DISPOSABLE) ×2 IMPLANT
GOWN STRL REUS W/TWL LRG LVL3 (GOWN DISPOSABLE) ×4
KIT MARKER MARGIN INK (KITS) ×2 IMPLANT
LIGHT WAVEGUIDE WIDE FLAT (MISCELLANEOUS) ×1 IMPLANT
NDL HYPO 25X1 1.5 SAFETY (NEEDLE) IMPLANT
NEEDLE HYPO 25X1 1.5 SAFETY (NEEDLE) ×2 IMPLANT
NS IRRIG 1000ML POUR BTL (IV SOLUTION) ×1 IMPLANT
PACK BASIN DAY SURGERY FS (CUSTOM PROCEDURE TRAY) ×2 IMPLANT
PENCIL BUTTON HOLSTER BLD 10FT (ELECTRODE) ×2 IMPLANT
SLEEVE SCD COMPRESS KNEE MED (MISCELLANEOUS) ×2 IMPLANT
SPONGE LAP 18X18 X RAY DECT (DISPOSABLE) ×2 IMPLANT
SUT MON AB 4-0 PC3 18 (SUTURE) ×1 IMPLANT
SUT SILK 2 0 SH (SUTURE) IMPLANT
SUT VICRYL 3-0 CR8 SH (SUTURE) ×2 IMPLANT
SYR CONTROL 10ML LL (SYRINGE) ×1 IMPLANT
TOWEL OR 17X24 6PK STRL BLUE (TOWEL DISPOSABLE) ×2 IMPLANT
TOWEL OR NON WOVEN STRL DISP B (DISPOSABLE) ×2 IMPLANT
TUBE CONNECTING 20X1/4 (TUBING) ×2 IMPLANT
YANKAUER SUCT BULB TIP NO VENT (SUCTIONS) ×1 IMPLANT

## 2016-06-11 NOTE — Anesthesia Procedure Notes (Signed)
Procedure Name: LMA Insertion Date/Time: 06/11/2016 11:10 AM Performed by: Margo Lama D Pre-anesthesia Checklist: Patient identified, Emergency Drugs available, Suction available and Patient being monitored Patient Re-evaluated:Patient Re-evaluated prior to inductionOxygen Delivery Method: Circle system utilized Preoxygenation: Pre-oxygenation with 100% oxygen Intubation Type: IV induction Ventilation: Mask ventilation without difficulty LMA: LMA inserted LMA Size: 3.0 Number of attempts: 1 Airway Equipment and Method: Bite block Placement Confirmation: positive ETCO2 Tube secured with: Tape Dental Injury: Teeth and Oropharynx as per pre-operative assessment

## 2016-06-11 NOTE — Transfer of Care (Signed)
Immediate Anesthesia Transfer of Care Note  Patient: Tami Mercado  Procedure(s) Performed: Procedure(s): BREAST LUMPECTOMY WITH RADIOACTIVE SEED LOCALIZATION (Right)  Patient Location: PACU  Anesthesia Type:General  Level of Consciousness: awake and patient cooperative  Airway & Oxygen Therapy: Patient Spontanous Breathing and Patient connected to face mask oxygen  Post-op Assessment: Report given to RN and Post -op Vital signs reviewed and stable  Post vital signs: Reviewed and stable  Last Vitals:  Vitals:   06/11/16 0921 06/11/16 1218  BP: (!) 169/53   Pulse: 70 84  Resp: 20 12  Temp: 36.4 C     Last Pain:  Vitals:   06/11/16 0921  TempSrc: Oral         Complications: No apparent anesthesia complications

## 2016-06-11 NOTE — Op Note (Signed)
06/11/2016  12:14 PM  PATIENT:  Tami Mercado  76 y.o. female  PRE-OPERATIVE DIAGNOSIS:  RIGHT BREAST ALH  POST-OPERATIVE DIAGNOSIS:  RIGHT BREAST ALH  PROCEDURE:  Procedure(s): BREAST LUMPECTOMY WITH RADIOACTIVE SEED LOCALIZATION (Right)  SURGEON:  Surgeon(s) and Role:    * Griselda Miner, MD - Primary  PHYSICIAN ASSISTANT:   ASSISTANTS: none   ANESTHESIA:   local and general  EBL:  Total I/O In: 1000 [I.V.:1000] Out: -   BLOOD ADMINISTERED:none  DRAINS: none   LOCAL MEDICATIONS USED:  MARCAINE     SPECIMEN:  Source of Specimen:  right breast tissue  DISPOSITION OF SPECIMEN:  PATHOLOGY  COUNTS:  YES  TOURNIQUET:  * No tourniquets in log *  DICTATION: .Dragon Dictation   After informed consent was obtained the patient was brought to the operating room and placed in the supine position on the operating table. After adequate induction of general anesthesia the patient's right breast was prepped with ChloraPrep, allowed to dry, and draped in usual sterile manner. An appropriate timeout was performed. Previously an I-125 seed was placed in the upper portion of the right breast to mark an area of atypical lobular hyperplasia. The neoprobe was set to I-125 in the area of radioactivity was readily identified. The area surrounding this was infiltrated with half percent Marcaine. A small curvilinear incision was made along the upper portion of the edge of the areola with a 15 blade knife. The incision was carried through the skin and subcutaneous tissue sharply with electrocautery. The neoprobe was then used to direct sharp and Bovie dissection towards the area of the radioactive seed. Once the area of the radioactive seed was closely approached and then I removed a circular portion of breast tissue sharply with the electrocautery around the radioactive seed while checking the area of radioactivity frequently with the neoprobe. Once the specimen was removed it was oriented with the  appropriate paint colors. A specimen radiograph was obtained that showed the clip and seed to be within the specimen. The specimen was then sent to pathology for further evaluation. Hemostasis was achieved using the Bovie electrocautery. The wound was irrigated with saline and infiltrated with half percent Marcaine. The deep layer of the wound was then closed with layers of interrupted 3-0 Vicryl stitches. Skin was then closed with interrupted 4-0 Monocryl subcuticular stitches. Dermabond dressings were applied. The patient tolerated the procedure well. At the end of the case all needle sponge and instrument counts were correct. The patient was then awakened and taken to recovery in stable condition.  PLAN OF CARE: Discharge to home after PACU  PATIENT DISPOSITION:  PACU - hemodynamically stable.   Delay start of Pharmacological VTE agent (>24hrs) due to surgical blood loss or risk of bleeding: not applicable

## 2016-06-11 NOTE — H&P (Signed)
Tami Mercado  Location: [facility] Patient #: [id number] DOB: 01/14/1943 Married / Language: English / Race: Asian Female   History of Present Illness  The patient is a 76 year old female who presents with a breast mass. We are asked to see the patient in consultation by Dr. Donette Larry to evaluate her for atypical lobular hyperplasia of the right breast. The patient is a 76 year old Asian female who recently went for a routine screening mammogram. She denied any breast pain or discharge from the nipple. At that time she was found to have a small abnormality in the upper inner right breast. This was biopsied and came back as atypical lobular hyperplasia. She has no personal or family history of breast cancer. She does not smoke.   Diagnostic Studies History Colonoscopy  1-5 years ago Pap Smear  1-5 years ago  Allergies  No Known Allergies No Known Drug Allergies  Allergies Reconciled   Medication History AmLODIPine Besylate (  Tablet, Oral daily) Active. Fenofibrate (  Tablet, Oral daily) Active. HydroCHLOROthiazide (12.5MG  Capsule, Oral daily) Active. Medications Reconciled  Social History  Caffeine use  Coffee. No drug use   Family History  Hypertension  Mother. Kidney Disease  Mother.  Pregnancy / Birth History Regular periods   Other Problems  Arthritis  High blood pressure     Review of Systems General Not Present- Appetite Loss, Chills, Fatigue, Fever, Night Sweats, Weight Gain and Weight Loss. Skin Not Present- Change in Wart/Mole, Dryness, Hives, Jaundice, New Lesions, Non-Healing Wounds, Rash and Ulcer. HEENT Not Present- Earache, Hearing Loss, Hoarseness, Nose Bleed, Oral Ulcers, Ringing in the Ears, Seasonal Allergies, Sinus Pain, Sore Throat, Visual Disturbances, Wears glasses/contact lenses and Yellow Eyes. Respiratory Not Present- Bloody sputum, Chronic Cough, Difficulty Breathing, Snoring and Wheezing. Breast Not  Present- Breast Mass, Breast Pain, Nipple Discharge and Skin Changes. Cardiovascular Not Present- Chest Pain, Difficulty Breathing Lying Down, Leg Cramps, Palpitations, Rapid Heart Rate, Shortness of Breath and Swelling of Extremities. Gastrointestinal Not Present- Abdominal Pain, Bloating, Bloody Stool, Change in Bowel Habits, Chronic diarrhea, Constipation, Difficulty Swallowing, Excessive gas, Gets full quickly at meals, Hemorrhoids, Indigestion, Nausea, Rectal Pain and Vomiting. Female Genitourinary Not Present- Frequency, Nocturia, Painful Urination, Pelvic Pain and Urgency. Musculoskeletal Not Present- Back Pain, Joint Pain, Joint Stiffness, Muscle Pain, Muscle Weakness and Swelling of Extremities. Neurological Not Present- Decreased Memory, Fainting, Headaches, Numbness, Seizures, Tingling, Tremor, Trouble walking and Weakness. Psychiatric Not Present- Anxiety, Bipolar, Change in Sleep Pattern, Depression, Fearful and Frequent crying. Endocrine Not Present- Cold Intolerance, Excessive Hunger, Hair Changes, Heat Intolerance, Hot flashes and New Diabetes. Hematology Not Present- Easy Bruising, Excessive bleeding, Gland problems, HIV and Persistent Infections.  Vitals  Weight: 109.8 lb Height: 62in Body Surface Area: 1.48 m Body Mass Index: 20.08 kg/m  Temp.: 97.65F  Pulse: 69 (Regular)  BP: 122/72 (Sitting, Left Arm, Standard)       Physical Exam General Mental Status-Alert. General Appearance-Consistent with stated age. Hydration-Well hydrated. Voice-Normal.  Head and Neck Head-normocephalic, atraumatic with no lesions or palpable masses. Trachea-midline. Thyroid Gland Characteristics - normal size and consistency.  Eye Eyeball - Bilateral-Extraocular movements intact. Sclera/Conjunctiva - Bilateral-No scleral icterus.  Chest and Lung Exam Chest and lung exam reveals -quiet, even and easy respiratory effort with no use of accessory muscles  and on auscultation, normal breath sounds, no adventitious sounds and normal vocal resonance. Inspection Chest Wall - Normal. Back - normal.  Breast Note: There is a palpable bruise in the upper portion of the right  breast. Other than this there is no palpable mass in either breast. There is no palpable axillary, supraclavicular, or cervical lymphadenopathy.   Cardiovascular Cardiovascular examination reveals -normal heart sounds, regular rate and rhythm with no murmurs and normal pedal pulses bilaterally. Note: There is a slight murmur heard at the upper sternal border bilaterally   Abdomen Inspection Inspection of the abdomen reveals - No Hernias. Skin - Scar - no surgical scars. Palpation/Percussion Palpation and Percussion of the abdomen reveal - Soft, Non Tender, No Rebound tenderness, No Rigidity (guarding) and No hepatosplenomegaly. Auscultation Auscultation of the abdomen reveals - Bowel sounds normal.  Neurologic Neurologic evaluation reveals -alert and oriented x 3 with no impairment of recent or remote memory. Mental Status-Normal.  Musculoskeletal Normal Exam - Left-Upper Extremity Strength Normal and Lower Extremity Strength Normal. Normal Exam - Right-Upper Extremity Strength Normal and Lower Extremity Strength Normal.  Lymphatic Head & Neck  General Head & Neck Lymphatics: Bilateral - Description - Normal. Axillary  General Axillary Region: Bilateral - Description - Normal. Tenderness - Non Tender. Femoral & Inguinal  Generalized Femoral & Inguinal Lymphatics: Bilateral - Description - Normal. Tenderness - Non Tender.    Assessment & Plan ATYPICAL LOBULAR HYPERPLASIA OF RIGHT BREAST (N60.91) Impression: The patient appears to have a small area of atypical lobular hyperplasia in the upper inner right breast. Because of its abnormal appearance and because it is considered a high risk lesion I would recommend that this area be removed. She would  also like to have this done. I have discussed with her in detail the risks and benefits of the operation to do this as well as some of the technical aspects and she understands and wishes to proceed. I will plan for a right breast radioactive seed localized lumpectomy. Current Plans Pt Education - Breast Diseases: discussed with patient and provided information.

## 2016-06-11 NOTE — Anesthesia Preprocedure Evaluation (Signed)
Anesthesia Evaluation  Patient identified by MRN, date of birth, ID band Patient awake    Reviewed: Allergy & Precautions, H&P , NPO status , Patient's Chart, lab work & pertinent test results  Airway Mallampati: II   Neck ROM: full    Dental   Pulmonary neg pulmonary ROS,    breath sounds clear to auscultation       Cardiovascular hypertension,  Rhythm:regular Rate:Normal     Neuro/Psych    GI/Hepatic   Endo/Other    Renal/GU      Musculoskeletal   Abdominal   Peds  Hematology   Anesthesia Other Findings   Reproductive/Obstetrics                             Anesthesia Physical Anesthesia Plan  ASA: II  Anesthesia Plan: General   Post-op Pain Management:    Induction: Intravenous  Airway Management Planned: LMA  Additional Equipment:   Intra-op Plan:   Post-operative Plan:   Informed Consent: I have reviewed the patients History and Physical, chart, labs and discussed the procedure including the risks, benefits and alternatives for the proposed anesthesia with the patient or authorized representative who has indicated his/her understanding and acceptance.     Plan Discussed with: CRNA, Anesthesiologist and Surgeon  Anesthesia Plan Comments:         Anesthesia Quick Evaluation  

## 2016-06-11 NOTE — Discharge Instructions (Signed)

## 2016-06-11 NOTE — Anesthesia Postprocedure Evaluation (Signed)
Anesthesia Post Note  Patient: Tami Mercado  Procedure(s) Performed: Procedure(s) (LRB): BREAST LUMPECTOMY WITH RADIOACTIVE SEED LOCALIZATION (Right)  Patient location during evaluation: PACU Anesthesia Type: General Level of consciousness: awake and alert and patient cooperative Pain management: pain level controlled Vital Signs Assessment: post-procedure vital signs reviewed and stable Respiratory status: spontaneous breathing and respiratory function stable Cardiovascular status: stable Anesthetic complications: no       Last Vitals:  Vitals:   06/11/16 1308 06/11/16 1349  BP:  (!) 155/60  Pulse: 82 65  Resp: 20 16  Temp:  36.4 C    Last Pain:  Vitals:   06/11/16 1349  TempSrc: Oral  PainSc: 1                  Novice Vrba S

## 2016-06-11 NOTE — Interval H&P Note (Signed)
History and Physical Interval Note:  06/11/2016 10:47 AM  Tami Mercado  has presented today for surgery, with the diagnosis of RIGHT BREAST ALH  The various methods of treatment have been discussed with the patient and family. After consideration of risks, benefits and other options for treatment, the patient has consented to  Procedure(s): BREAST LUMPECTOMY WITH RADIOACTIVE SEED LOCALIZATION (Right) as a surgical intervention .  The patient's history has been reviewed, patient examined, no change in status, stable for surgery.  I have reviewed the patient's chart and labs.  Questions were answered to the patient's satisfaction.     TOTH III,Tawania Daponte S

## 2016-06-12 ENCOUNTER — Encounter (HOSPITAL_BASED_OUTPATIENT_CLINIC_OR_DEPARTMENT_OTHER): Payer: Self-pay | Admitting: General Surgery

## 2016-07-16 NOTE — Progress Notes (Signed)
[facility]  Telephone:([phone] Fax:([phone]) [phone]  Clinic Progress Note   Patient Care Team: Georgann Housekeeper, MD as PCP - General (Internal Medicine) 07/17/2016  REFERRAL PHYSICIAN: Dr. Carolynne Edouard   CHIEF COMPLAINTS/PURPOSE OF CONSULTATION:  Right Breast Atypical Lobular Hyperplasia  HISTORY OF PRESENTING ILLNESS (07/17/16):  Tami Mercado 76 y.o. female is here because of referral from Dr. Carolynne Edouard. She is accompanied today by her husband.   The patient regularly has yearly mammograms, which revealed possible right breast distortion warranting further evaluation on 04/04/16. She did not palpate an abnormality in her breasts. Right Breast TOMO on 04/11/16 showed persistent distortion in the upper inner quadrant of the right breast. Subsequent biopsy of the right breast on 04/16/16 revealed atypical lobular hyperplasia and complex sclerosing lesion.   The patient proceeded with right breast lumpectomy on 06/11/16 with Dr. Carolynne Edouard, which revealed lobular neoplasia (lobular carcinoma in situ) and complex sclerosing lesion with calcifications. She reports she began menopause at age 47, and experienced severe side effects including hot flashes.   At her husband's urging, the patient reports spells of dizziness every 3-4 months, since childhood, during which time her face gets "very cold," and she is nauseous. The patient experiences this vertigo without warning. She reports "once I get really, really sick, I get over it." This lasts for approximately 2 weeks, though it peaks after the first 3-4 days according to her husband. She has discussed this with her doctors, including ENT, and does not have any answers as to what could be causing this.  The patient reports she performs self breast exams. She takes calcium and vitamin D regularly. She stays physically active and works full time without difficulty. She reports she used oral HRT for 10 years due to severe hot flashes, but now only uses  estrogen cream. The patient does not smoke or drink alcohol.   GYN HISTORY  Menarchal: 15-16 LMP: 1985 Contraceptive: not reported HRT: 10 years, stopped 20 years ago G0P0    MEDICAL HISTORY:  Past Medical History:  Diagnosis Date  . Breast mass, right   . Glaucoma   . Hyperlipemia   . Hypertension     SURGICAL HISTORY: Past Surgical History:  Procedure Laterality Date  . BREAST LUMPECTOMY WITH RADIOACTIVE SEED LOCALIZATION Right 06/11/2016   Procedure: BREAST LUMPECTOMY WITH RADIOACTIVE SEED LOCALIZATION;  Surgeon: Chevis Pretty III, MD;  Location: [facility];  Service: General;  Laterality: Right;  . COLONOSCOPY WITH PROPOFOL N/A 03/01/2015   Procedure: COLONOSCOPY WITH PROPOFOL;  Surgeon: Charolett Bumpers, MD;  Location: [facility];  Service: Endoscopy;  Laterality: N/A;  . EYE SURGERY      SOCIAL HISTORY: Social History   Social History  . Marital status: Married    Spouse name: N/A  . Number of children: N/A  . Years of education: N/A   Occupational History  . Not on file.   Social History Main Topics  . Smoking status: Never Smoker  . Smokeless tobacco: Never Used  . Alcohol use No  . Drug use: No  . Sexual activity: Not on file   Other Topics Concern  . Not on file   Social History Narrative  . No narrative on file    FAMILY HISTORY: Family History  Problem Relation Age of Onset  . Hypertension Mother     ALLERGIES:  is allergic to amoxicillin.  MEDICATIONS:  Current Outpatient Prescriptions  Medication Sig Dispense Refill  . amLODipine (NORVASC) 10 MG tablet Take  10 mg by mouth daily.    . brimonidine (ALPHAGAN P) 0.1 % SOLN     . dorzolamide (TRUSOPT) 2 % ophthalmic solution 1 drop 3 (three) times daily.    . fenofibrate (TRICOR) 145 MG tablet Take 145 mg by mouth daily.    . hydrochlorothiazide (HYDRODIURIL) 12.5 MG tablet Take 12.5 mg by mouth daily.  3  . Multiple Vitamins-Minerals (EYE VITAMINS PO) Take 1 tablet by  mouth daily.    . Omega-3 Fatty Acids (FISH OIL) 1000 MG CAPS Take by mouth.     No current facility-administered medications for this visit.     REVIEW OF SYSTEMS:   Constitutional: Denies fevers, chills or abnormal night sweats (+) occasional vertigo Eyes: Denies blurriness of vision, double vision or watery eyes Ears, nose, mouth, throat, and face: Denies mucositis or sore throat Respiratory: Denies cough, dyspnea or wheezes Cardiovascular: Denies palpitation, chest discomfort or lower extremity swelling Gastrointestinal:  Denies nausea, heartburn or change in bowel habits Skin: Denies abnormal skin rashes Lymphatics: Denies new lymphadenopathy or easy bruising Neurological:Denies numbness, tingling or new weaknesses Behavioral/Psych: Mood is stable, no new changes  All other systems were reviewed with the patient and are negative.  PHYSICAL EXAMINATION: ECOG PERFORMANCE STATUS: 0 - Asymptomatic  Vitals:   07/17/16 1048  BP: (!) 165/75  Pulse: 66  Resp: 18  Temp: 98.2 F (36.8 C)   Filed Weights   07/17/16 1048  Weight: 109 lb 14.4 oz (49.9 kg)    GENERAL:alert, no distress and comfortable SKIN: skin color, texture, turgor are normal, no rashes or significant lesions EYES: normal, conjunctiva are pink and non-injected, sclera clear OROPHARYNX:no exudate, no erythema and lips, buccal mucosa, and tongue normal  NECK: supple, thyroid normal size, non-tender, without nodularity LYMPH:  no palpable lymphadenopathy in the cervical, axillary or inguinal LUNGS: clear to auscultation and percussion with normal breathing effort HEART: regular rate & rhythm and no murmurs and no lower extremity edema ABDOMEN:abdomen soft, non-tender and normal bowel sounds Musculoskeletal:no cyanosis of digits and no clubbing  PSYCH: alert & oriented x 3 with fluent speech NEURO: no focal motor/sensory deficits Breast exam deferred today.  LABORATORY DATA:  I have reviewed the data as  listed No flowsheet data found.  No flowsheet data found.  CMP Latest Ref Rng & Units 06/05/2016  Glucose 65 - 99 mg/dL 960(A)  BUN 6 - 20 mg/dL 54(U)  Creatinine 9.81 - 1.00 mg/dL 1.91  Sodium 478 - 295 mmol/L 142  Potassium 3.5 - 5.1 mmol/L 4.9  Chloride 101 - 111 mmol/L 106  CO2 22 - 32 mmol/L 31  Calcium 8.9 - 10.3 mg/dL 9.3   PATHOLOGY  Surgical Pathology 06/11/16 Diagnosis Breast, lumpectomy, Right - LOBULAR NEOPLASIA (LOBULAR CARCINOMA IN SITU). - COMPLEX SCLEROSING LESION WITH CALCIFICATIONS. - ADENOSIS. - BIOPSY SITE. Microscopic Comment Immunohistochemistry reveals preservation of basal cell markers (p63, calponin, smooth muscle myosin). Valinda Hoar MD Pathologist, Electronic Signature (Case signed 06/13/2016) Specimen Gross and Clinical Information Specimen(s) Obtained: Breast, lumpectomy, Right Specimen Clinical Information Right breast ALH (tl) Gross Specimen type: Received fresh and placed in formalin at 12:10 p.m. is a right breast lumpectomy specimen. Size: 3.2 cm from superior to inferior x 2.2 cm from medial to lateral x 1.5 cm from anterior to posterior. Orientation: The specimen has been previously inked green anterior, blue inferior, orange lateral, yellow medial, black posterior, and red superior. Localized area: A green pin is placed to indicate a radioactive seed and a yellow pin to  indicate a biopsy clip. The radioactive seed is retrieved and sent to nuclear medicine. Cut surface: Sectioning reveals a 1.2 x 1.0 x 0.5 cm ill-defined area of dense white fibrous tissue with adjacent golden yellow possible fat necrosis, containing an X-shaped biopsy clip. The remaining cut surface is approximately 50% yellow lobulated adipose tissue and 50% dense white fibrous tissue. Margins: Area of interest is located at the superior and lateral margin, 0.3 cm from the anterior and posterior, 0.8 cm 1 of 2 FINAL for Tami DownsJACKSON, Tami C  ([id number]) Gross(continued) from the medial and is far from the inferior margin. Prognostic indicators: Not taken at time of gross. Block summary: Area of interest is entirely submitted. A = lesion with superior margin B = lesion with superior, posterior and lateral margin C-E = lesion with posterior and lateral F = anterior and medial margin G = inferior margin and uninvolved fibrous tissue. Seven blocks. (KF:ah 06/11/16)  Surgical Pathology 04/16/16 Diagnosis Breast, right, needle core biopsy, upper inner quadrant ATYPICAL LOBULAR HYPERPLASIA COMPLEX SCLEROSING LESION  RADIOGRAPHIC STUDIES: I have personally reviewed the radiological images as listed and agreed with the findings in the report. No results found.   Diagnostic Breast TOMO Bilateral 04/11/16 IMPRESSION: 1. There is a persistent distortion in the upper slightly inner quadrant of the right breast. 2.  No evidence of right axillary lymphadenopathy.  Bilateral Screening Mammogram 04/04/16 FINDINGS: In the right breast, possible distortion warrants further evaluation. In the left breast, no findings suspicious for malignancy. Images were processed with CAD. IMPRESSION: Further evaluation is suggested for possible distortion in the right breast.  ASSESSMENT & PLAN:  76 y.o. postmenopausal Caucasian female with:  #1 right breast atypical lobular hyperplasia -I discussed her imaging findings and surgical pathology results with her in great details. -We reviewed the natural history of atypical hyperplasia. It is consider a benign breast disease, however it does increase the risk of breast cancer by 3-5 fold. It is considered as a high risk for breast cancer. -We discussed the other risk factors for breast cancer, such as family history, obesity, late menopause, etc. she does not have other breast cancer risks, except that she was never pregnant.  -We discussed annual screening mammogram, which will detect early stage  breast cancer. She agrees to continue. She is very compliant on screening -Due to her dense breast tissue (D), we can also consider screening breast MRI, I recommend her to have mammograms and breast MRI alternatively every 6 months.  -We also discussed healthy diet and regular exercise, calcium and vitamin D supplement, to reduce her risk of breast cancer -She is currently using vaginal hormonal cream for dryness, which can potentially increase the risk of breast cancer. I recommend her to stop.  -We further discussed chemoprevention to breast cancer. I discussed the option of tamoxifen, raloxifene and anastrozole. These endocrine therapy agent is likely going to reduce the risk of breast cancer by 30-40%, however there is no data of survival benefit so far.  -The different side effects of these 3 agents were discussed with patient in great details. -after a lengthy discussion, patient decided not to proceed with chemoprevention, and instead opts for active surveillance with breast MRI, mammograms, and close follow ups with her PCP, surgeon, and gynecologist. - I recommend the patient's first breast MRI in August, with mammogram next February. -I encouraged her to continue healthy diet, exercise regularly, maintaining her normal weight, and being positive. She agrees with it.   #2 Bone health - The  patient has never had a DEXA scan. - She is on vitamin D and calcium supplement. - I discussed tamoxifen and raloxifene can increased her bone density.  #3 Osteoporosis - Patient is currently regularly receiving Prolia injections every 6 months at PCP office -I recommend her to take calcium and vitamin D supplement.  Plan: - The patient has declined chemoprevention at this time. - She will proceed with alternating breast MRIs and mammograms every 6 months as well as close follow ups with her PCP, surgeon, and gynecologist. - Patient may follow up with me as needed in the future.  No orders of the  defined types were placed in this encounter.   All questions were answered. The patient knows to call the clinic with any problems, questions or concerns.  I spent 45 minutes counseling the patient face to face. The total time spent in the appointment was 55 minutes and more than 50% was on counseling.  This document serves as a record of services personally performed by Malachy Mood, MD. It was created on her behalf by Lavenia Atlas, a trained medical scribe. The creation of this record is based on the scribe's personal observations and the provider's statements to them. This document has been checked and approved by the attending provider.     Malachy Mood, MD 07/17/2016

## 2016-07-17 ENCOUNTER — Encounter: Payer: Self-pay | Admitting: Hematology

## 2016-07-17 ENCOUNTER — Ambulatory Visit (HOSPITAL_BASED_OUTPATIENT_CLINIC_OR_DEPARTMENT_OTHER): Payer: BLUE CROSS/BLUE SHIELD | Admitting: Hematology

## 2016-07-17 DIAGNOSIS — M810 Age-related osteoporosis without current pathological fracture: Secondary | ICD-10-CM | POA: Diagnosis not present

## 2016-07-17 DIAGNOSIS — N6091 Unspecified benign mammary dysplasia of right breast: Secondary | ICD-10-CM | POA: Diagnosis not present

## 2016-07-23 ENCOUNTER — Telehealth: Payer: Self-pay | Admitting: Hematology

## 2016-07-23 NOTE — Telephone Encounter (Signed)
No los per 07/17/16 visit. °

## 2017-05-25 ENCOUNTER — Other Ambulatory Visit: Payer: Self-pay | Admitting: Internal Medicine

## 2017-05-25 DIAGNOSIS — H5350 Unspecified color vision deficiencies: Secondary | ICD-10-CM

## 2017-06-03 ENCOUNTER — Ambulatory Visit
Admission: RE | Admit: 2017-06-03 | Discharge: 2017-06-03 | Disposition: A | Discharge status: Home / Self Care | Payer: BLUE CROSS/BLUE SHIELD | Source: Ambulatory Visit | Attending: Internal Medicine | Admitting: Internal Medicine

## 2017-06-03 DIAGNOSIS — H5350 Unspecified color vision deficiencies: Secondary | ICD-10-CM

## 2017-06-03 MED ORDER — GADOBENATE DIMEGLUMINE 529 MG/ML IV SOLN
10.0000 mL | Freq: Once | INTRAVENOUS | Status: AC | PRN
  Administered 2017-06-03: 10 mL via INTRAVENOUS

## 2017-11-20 ENCOUNTER — Other Ambulatory Visit: Payer: Self-pay | Admitting: Internal Medicine

## 2017-11-20 DIAGNOSIS — Z1231 Encounter for screening mammogram for malignant neoplasm of breast: Secondary | ICD-10-CM

## 2017-12-18 ENCOUNTER — Ambulatory Visit: Payer: BLUE CROSS/BLUE SHIELD

## 2018-01-19 ENCOUNTER — Ambulatory Visit
Admission: RE | Admit: 2018-01-19 | Discharge: 2018-01-19 | Disposition: A | Discharge status: Home / Self Care | Payer: BLUE CROSS/BLUE SHIELD | Source: Ambulatory Visit | Attending: Internal Medicine | Admitting: Internal Medicine

## 2018-01-19 DIAGNOSIS — Z1231 Encounter for screening mammogram for malignant neoplasm of breast: Secondary | ICD-10-CM

## 2018-12-02 ENCOUNTER — Other Ambulatory Visit: Payer: Self-pay | Admitting: Internal Medicine

## 2018-12-02 DIAGNOSIS — Z1231 Encounter for screening mammogram for malignant neoplasm of breast: Secondary | ICD-10-CM

## 2019-02-03 ENCOUNTER — Ambulatory Visit
Admission: RE | Admit: 2019-02-03 | Discharge: 2019-02-03 | Disposition: A | Discharge status: Home / Self Care | Payer: BLUE CROSS/BLUE SHIELD | Source: Ambulatory Visit | Attending: Internal Medicine | Admitting: Internal Medicine

## 2019-02-03 ENCOUNTER — Other Ambulatory Visit: Payer: Self-pay

## 2019-02-03 DIAGNOSIS — Z1231 Encounter for screening mammogram for malignant neoplasm of breast: Secondary | ICD-10-CM

## 2019-03-22 ENCOUNTER — Ambulatory Visit: Payer: Managed Care, Other (non HMO) | Attending: Internal Medicine

## 2019-03-22 DIAGNOSIS — Z23 Encounter for immunization: Secondary | ICD-10-CM | POA: Insufficient documentation

## 2019-03-22 NOTE — Progress Notes (Signed)
   Covid-19 Vaccination Clinic  Name:  YESSENIA MAILLET    MRN: [id number] DOB: 01/14/1943  03/22/2019  Ms. Fetterly was observed post Covid-19 immunization for 15 minutes without incidence. She was provided with Vaccine Information Sheet and instruction to access the V-Safe system.   Ms. Carvell was instructed to call 911 with any severe reactions post vaccine: Marland Kitchen Difficulty breathing  . Swelling of your face and throat  . A fast heartbeat  . A bad rash all over your body  . Dizziness and weakness    Immunizations Administered    Name Date Dose VIS Date Route   Pfizer COVID-19 Vaccine 03/22/2019  2:26 PM 0.3 mL 02/11/2019 Intramuscular   Manufacturer: [employer], [employer]   Lot: [id number]   NDC: [address]-1

## 2019-04-12 ENCOUNTER — Ambulatory Visit: Payer: Managed Care, Other (non HMO) | Attending: Internal Medicine

## 2019-04-12 ENCOUNTER — Ambulatory Visit: Payer: Managed Care, Other (non HMO)

## 2019-04-12 DIAGNOSIS — Z23 Encounter for immunization: Secondary | ICD-10-CM | POA: Insufficient documentation

## 2019-04-12 NOTE — Progress Notes (Signed)
   Covid-19 Vaccination Clinic  Name:  Tami Mercado    MRN: [id number] DOB: 01/14/1943  04/12/2019  Tami Mercado was observed post Covid-19 immunization for 15 minutes without incidence. She was provided with Vaccine Information Sheet and instruction to access the V-Safe system.   Tami Mercado was instructed to call 911 with any severe reactions post vaccine: Marland Kitchen Difficulty breathing  . Swelling of your face and throat  . A fast heartbeat  . A bad rash all over your body  . Dizziness and weakness    Immunizations Administered    Name Date Dose VIS Date Route   Pfizer COVID-19 Vaccine 04/12/2019  9:01 AM 0.3 mL 02/11/2019 Intramuscular   Manufacturer: [employer], [employer]   Lot: [id number]   NDC: [address]-3

## 2019-07-06 ENCOUNTER — Encounter (INDEPENDENT_AMBULATORY_CARE_PROVIDER_SITE_OTHER): Payer: Self-pay | Admitting: Ophthalmology

## 2019-07-06 ENCOUNTER — Ambulatory Visit (INDEPENDENT_AMBULATORY_CARE_PROVIDER_SITE_OTHER): Payer: Managed Care, Other (non HMO) | Admitting: Ophthalmology

## 2019-07-06 ENCOUNTER — Other Ambulatory Visit: Payer: Self-pay

## 2019-07-06 DIAGNOSIS — H40012 Open angle with borderline findings, low risk, left eye: Secondary | ICD-10-CM | POA: Diagnosis not present

## 2019-07-06 DIAGNOSIS — H353113 Nonexudative age-related macular degeneration, right eye, advanced atrophic without subfoveal involvement: Secondary | ICD-10-CM | POA: Insufficient documentation

## 2019-07-06 DIAGNOSIS — H353124 Nonexudative age-related macular degeneration, left eye, advanced atrophic with subfoveal involvement: Secondary | ICD-10-CM | POA: Insufficient documentation

## 2019-07-06 DIAGNOSIS — H348122 Central retinal vein occlusion, left eye, stable: Secondary | ICD-10-CM | POA: Diagnosis not present

## 2019-07-06 NOTE — Assessment & Plan Note (Signed)

## 2019-07-06 NOTE — Progress Notes (Signed)
07/06/2019     CHIEF COMPLAINT Patient presents for Retina Follow Up   HISTORY OF PRESENT ILLNESS: Tami Mercado is a 79 y.o. female who presents to the clinic today for:   HPI    Retina Follow Up    Patient presents with  Other.  In right eye.  Duration of 5 months.  Since onset it is gradually worsening.          Comments    Patient states that for the past 5 months she has noticed that her right eye is beginning to see lines as curvy. Patient states that she also has a really hard time recognizing people and faces unless she is very close. Patient states that everything looks very dark and very foggy.       Last edited by Gerda Diss on 07/06/2019  8:36 AM. (History)      Referring physician: Wenda Low, MD [address] [place],  [place] [address]  HISTORICAL INFORMATION:   Selected notes from the MEDICAL RECORD NUMBER       CURRENT MEDICATIONS: Current Outpatient Medications (Ophthalmic Drugs)  Medication Sig  . brimonidine (ALPHAGAN P) 0.1 % SOLN   . dorzolamide (TRUSOPT) 2 % ophthalmic solution 1 drop 3 (three) times daily.   No current facility-administered medications for this visit. (Ophthalmic Drugs)   Current Outpatient Medications (Other)  Medication Sig  . amLODipine (NORVASC) 10 MG tablet Take 10 mg by mouth daily.  . fenofibrate (TRICOR) 145 MG tablet Take 145 mg by mouth daily.  . hydrochlorothiazide (HYDRODIURIL) 12.5 MG tablet Take 12.5 mg by mouth daily.  . Multiple Vitamins-Minerals (EYE VITAMINS PO) Take 1 tablet by mouth daily.  . Omega-3 Fatty Acids (FISH OIL) 1000 MG CAPS Take by mouth.   No current facility-administered medications for this visit. (Other)      REVIEW OF SYSTEMS:    ALLERGIES Allergies  Allergen Reactions  . Amoxicillin Other (See Comments)    Upsets stomach    PAST MEDICAL HISTORY Past Medical History:  Diagnosis Date  . Breast mass, right   . Glaucoma   . Hyperlipemia   .  Hypertension    Past Surgical History:  Procedure Laterality Date  . BREAST EXCISIONAL BIOPSY Right   . BREAST LUMPECTOMY WITH RADIOACTIVE SEED LOCALIZATION Right 06/11/2016   Procedure: BREAST LUMPECTOMY WITH RADIOACTIVE SEED LOCALIZATION;  Surgeon: Autumn Messing III, MD;  Location: [place];  Service: General;  Laterality: Right;  . COLONOSCOPY WITH PROPOFOL N/A 03/01/2015   Procedure: COLONOSCOPY WITH PROPOFOL;  Surgeon: Garlan Fair, MD;  Location: [facility];  Service: Endoscopy;  Laterality: N/A;  . EYE SURGERY      FAMILY HISTORY Family History  Problem Relation Age of Onset  . Hypertension Mother     SOCIAL HISTORY Social History   Tobacco Use  . Smoking status: Never Smoker  . Smokeless tobacco: Never Used  Substance Use Topics  . Alcohol use: No  . Drug use: No         OPHTHALMIC EXAM:  Base Eye Exam    Visual Acuity (Snellen - Linear)      Right Left   Dist [place] 20/50+2 20/400   Dist ph [place] 20/40-1 NI       Tonometry (Tonopen, 8:37 AM)      Right Left   Pressure 18 18       Pupils      Pupils Dark Light Shape React APD  Right PERRL 5 4 Round Brisk None   Left PERRL 3 3 Round Minimal +1       Visual Fields (Counting fingers)      Left Right     Full   Restrictions Partial outer superior nasal, inferior nasal deficiencies        Extraocular Movement      Right Left    Full Full       Neuro/Psych    Oriented x3: Yes   Mood/Affect: Normal       Dilation    Both eyes: 1.0% Mydriacyl, 2.5% Phenylephrine @ 8:37 AM        Slit Lamp and Fundus Exam    External Exam      Right Left   External Normal Normal       Slit Lamp Exam      Right Left   Lids/Lashes Normal Normal   Conjunctiva/Sclera White and quiet White and quiet   Cornea Clear Clear   Anterior Chamber Deep and quiet Deep and quiet   Iris Round and reactive Round and reactive   Lens Posterior chamber intraocular lens Posterior chamber intraocular lens    Anterior Vitreous Normal Normal       Fundus Exam      Right Left   Posterior Vitreous Normal Normal   Disc Normal 2+ Pallor, old collaterals.   C/D Ratio 0.35 0.75   Macula Atrophy, Geographic atrophy, Advanced age related macular degeneration Atrophy, Geographic atrophy, Advanced age related macular degeneration   Vessels Normal Old central retinal vein occlusion left eye, compensated, poor perfusion peripherally   Periphery Normal Normal          IMAGING AND PROCEDURES  Imaging and Procedures for 07/06/19  OCT, Retina - OU - Both Eyes       Right Eye Quality was good. Scan locations included subfoveal.   Left Eye Quality was good. Scan locations included subfoveal.                 ASSESSMENT/PLAN:  Advanced nonexudative age-related macular degeneration of left eye with subfoveal involvement The nature of dry age related macular degeneration was discussed with the patient as well as its possible conversion to wet. The results of the AREDS 2 study was discussed with the patient. A diet rich in dark leafy green vegetables was advised and specific recommendations were made regarding supplements with AREDS 2 formulation . Control of hypertension and serum cholesterol may slow the disease. Smoking cessation is mandatory to slow the disease and diminish the risk of progressing to wet age related macular degeneration. The patient was instructed in the use of an Amsler Grid and was told to return immediately for any changes in the Grid. Stressed to the patient do not rub eyes      ICD-10-CM   1. Advanced nonexudative age-related macular degeneration of left eye with subfoveal involvement  H35.3124   2. Advanced nonexudative age-related macular degeneration of right eye without subfoveal involvement  H35.3113   3. Stable hemispheric central retinal vein occlusion (CRVO) of left eye  H34.8122   4. Open angle glaucoma cupping of optic discs, left  H40.012     1.  Old central  retinal vein occlusion of the left eye, stable will observe  2.  Dry age-related maculopathy with each eye with no complications of CNVM OU.  3.  Ophthalmic Meds Ordered this visit:  No orders of the defined types were placed in this encounter.  Return in about 6 months (around 01/06/2020) for DILATE OU, COLOR FP, OCT.  There are no Patient Instructions on file for this visit.   Explained the diagnoses, plan, and follow up with the patient and they expressed understanding.  Patient expressed understanding of the importance of proper follow up care.   Alford Highland Estalee Mccandlish M.D. Diseases & Surgery of the Retina and Vitreous [facility] 07/06/19     Abbreviations: M myopia (nearsighted); A astigmatism; H hyperopia (farsighted); P presbyopia; Mrx spectacle prescription;  CTL contact lenses; OD right eye; OS left eye; OU both eyes  XT exotropia; ET esotropia; PEK punctate epithelial keratitis; PEE punctate epithelial erosions; DES dry eye syndrome; MGD meibomian gland dysfunction; ATs artificial tears; PFAT's preservative free artificial tears; NSC nuclear sclerotic cataract; PSC posterior subcapsular cataract; ERM epi-retinal membrane; PVD posterior vitreous detachment; RD retinal detachment; DM diabetes mellitus; DR diabetic retinopathy; NPDR non-proliferative diabetic retinopathy; PDR proliferative diabetic retinopathy; CSME clinically significant macular edema; DME diabetic macular edema; dbh dot blot hemorrhages; CWS cotton wool spot; POAG primary open angle glaucoma; C/D cup-to-disc ratio; HVF humphrey visual field; GVF goldmann visual field; OCT optical coherence tomography; IOP intraocular pressure; BRVO Branch retinal vein occlusion; CRVO central retinal vein occlusion; CRAO central retinal artery occlusion; BRAO branch retinal artery occlusion; RT retinal tear; SB scleral buckle; PPV pars plana vitrectomy; VH Vitreous hemorrhage; PRP panretinal laser photocoagulation;  IVK intravitreal kenalog; VMT vitreomacular traction; MH Macular hole;  NVD neovascularization of the disc; NVE neovascularization elsewhere; AREDS age related eye disease study; ARMD age related macular degeneration; POAG primary open angle glaucoma; EBMD epithelial/anterior basement membrane dystrophy; ACIOL anterior chamber intraocular lens; IOL intraocular lens; PCIOL posterior chamber intraocular lens; Phaco/IOL phacoemulsification with intraocular lens placement; PRK photorefractive keratectomy; LASIK laser assisted in situ keratomileusis; HTN hypertension; DM diabetes mellitus; COPD chronic obstructive pulmonary disease

## 2019-08-11 ENCOUNTER — Encounter (INDEPENDENT_AMBULATORY_CARE_PROVIDER_SITE_OTHER): Payer: Managed Care, Other (non HMO) | Admitting: Ophthalmology

## 2020-01-05 ENCOUNTER — Other Ambulatory Visit: Payer: Self-pay

## 2020-01-05 ENCOUNTER — Encounter (INDEPENDENT_AMBULATORY_CARE_PROVIDER_SITE_OTHER): Payer: Self-pay | Admitting: Ophthalmology

## 2020-01-05 ENCOUNTER — Ambulatory Visit (INDEPENDENT_AMBULATORY_CARE_PROVIDER_SITE_OTHER): Payer: Medicare Other | Admitting: Ophthalmology

## 2020-01-05 DIAGNOSIS — H401112 Primary open-angle glaucoma, right eye, moderate stage: Secondary | ICD-10-CM

## 2020-01-05 DIAGNOSIS — H353212 Exudative age-related macular degeneration, right eye, with inactive choroidal neovascularization: Secondary | ICD-10-CM

## 2020-01-05 DIAGNOSIS — H353124 Nonexudative age-related macular degeneration, left eye, advanced atrophic with subfoveal involvement: Secondary | ICD-10-CM | POA: Diagnosis not present

## 2020-01-05 DIAGNOSIS — H348122 Central retinal vein occlusion, left eye, stable: Secondary | ICD-10-CM

## 2020-01-05 DIAGNOSIS — H353113 Nonexudative age-related macular degeneration, right eye, advanced atrophic without subfoveal involvement: Secondary | ICD-10-CM

## 2020-01-05 NOTE — Assessment & Plan Note (Signed)
History of intravitreal Avastin right eye, last injection required, June 2017.

## 2020-01-05 NOTE — Assessment & Plan Note (Signed)
Management of glaucoma OD as per Dr. Lottie Dawson

## 2020-01-05 NOTE — Progress Notes (Signed)
01/05/2020     CHIEF COMPLAINT Patient presents for Retina Follow Up   HISTORY OF PRESENT ILLNESS: Tami Mercado is a 79 y.o. female who presents to the clinic today for:   HPI    Retina Follow Up    Patient presents with  Dry AMD.  In both eyes.  Severity is moderate.  Duration of 6 months.  Since onset it is stable.  I, the attending physician,  performed the HPI with the patient and updated documentation appropriately.          Comments    6 Month f\u OU. FP and OCT  Pt has noticed a decrease in vision. Pt states she has a hard time reading road signs until she is closer to them, therefore causing reluctance toward night driving. Using gtts as directed by Dr. Lottie Dawson.        Last edited by Elyse Jarvis on 01/05/2020  8:12 AM. (History)      Referring physician: Georgann Housekeeper, MD [address] [place],  [place] [address]  HISTORICAL INFORMATION:   Selected notes from the MEDICAL RECORD NUMBER       CURRENT MEDICATIONS: Current Outpatient Medications (Ophthalmic Drugs)  Medication Sig  . dorzolamide-timolol (COSOPT) 22.3-6.8 MG/ML ophthalmic solution Place 1 drop into both eyes every 12 hours.  . Netarsudil-Latanoprost (ROCKLATAN) 0.02-0.005 % SOLN Place 1 drop into the right eye daily.  . pilocarpine (PILOCAR) 1 % ophthalmic solution Place 1 drop into the right eye 3 times daily.  . brimonidine (ALPHAGAN P) 0.1 % SOLN   . dorzolamide (TRUSOPT) 2 % ophthalmic solution 1 drop 3 (three) times daily.   No current facility-administered medications for this visit. (Ophthalmic Drugs)   Current Outpatient Medications (Other)  Medication Sig  . amLODipine (NORVASC) 10 MG tablet Take 10 mg by mouth daily.  . fenofibrate (TRICOR) 145 MG tablet Take 145 mg by mouth daily.  . hydrochlorothiazide (HYDRODIURIL) 12.5 MG tablet Take 12.5 mg by mouth daily.  Marland Kitchen losartan-hydrochlorothiazide (HYZAAR) 100-12.5 MG tablet Take 1 tablet by mouth daily.  . metFORMIN  (GLUCOPHAGE) 500 MG tablet Take 500 mg by mouth daily.  . Multiple Vitamins-Minerals (EYE VITAMINS PO) Take 1 tablet by mouth daily.  . Omega-3 Fatty Acids (FISH OIL) 1000 MG CAPS Take by mouth.   No current facility-administered medications for this visit. (Other)      REVIEW OF SYSTEMS:    ALLERGIES Allergies  Allergen Reactions  . Amoxicillin Other (See Comments)    Upsets stomach    PAST MEDICAL HISTORY Past Medical History:  Diagnosis Date  . Breast mass, right   . Glaucoma   . Hyperlipemia   . Hypertension    Past Surgical History:  Procedure Laterality Date  . BREAST EXCISIONAL BIOPSY Right   . BREAST LUMPECTOMY WITH RADIOACTIVE SEED LOCALIZATION Right 06/11/2016   Procedure: BREAST LUMPECTOMY WITH RADIOACTIVE SEED LOCALIZATION;  Surgeon: Chevis Pretty III, MD;  Location: [facility];  Service: General;  Laterality: Right;  . COLONOSCOPY WITH PROPOFOL N/A 03/01/2015   Procedure: COLONOSCOPY WITH PROPOFOL;  Surgeon: Charolett Bumpers, MD;  Location: [facility];  Service: Endoscopy;  Laterality: N/A;  . EYE SURGERY      FAMILY HISTORY Family History  Problem Relation Age of Onset  . Hypertension Mother     SOCIAL HISTORY Social History   Tobacco Use  . Smoking status: Never Smoker  . Smokeless tobacco: Never Used  Substance Use Topics  .  Alcohol use: No  . Drug use: No         OPHTHALMIC EXAM: Base Eye Exam    Visual Acuity (Snellen - Linear)      Right Left   Dist [place] 20/30 -1 20/400   Dist ph [place]  20/200       Tonometry (Tonopen, 8:17 AM)      Right Left   Pressure 18 15       Tonometry #2 (Tonopen, 8:17 AM)      Right Left   Pressure 16 13       Pupils      Pupils Dark Light Shape React APD   Right PERRL 2 2 Round Minimal None   Left PERRL 3 3 Round Minimal +2       Visual Fields (Counting fingers)      Left Right     Full   Restrictions Partial outer superior temporal, superior nasal, inferior nasal deficiencies         Neuro/Psych    Oriented x3: Yes   Mood/Affect: Normal       Dilation    Both eyes: 1.0% Mydriacyl, 2.5% Phenylephrine @ 8:17 AM        Slit Lamp and Fundus Exam    External Exam      Right Left   External Normal Normal       Slit Lamp Exam      Right Left   Lids/Lashes Normal Normal   Conjunctiva/Sclera White and quiet White and quiet   Cornea Clear Clear   Anterior Chamber Deep and quiet Deep and quiet   Iris Round and reactive Round and reactive   Lens Posterior chamber intraocular lens Posterior chamber intraocular lens   Anterior Vitreous Normal Normal       Fundus Exam      Right Left   Posterior Vitreous Normal Normal   Disc Normal 2+ Pallor, old collaterals.   C/D Ratio 0.35 0.9   Macula Atrophy, Geographic atrophy, Advanced age related macular degeneration, calcified atrophic drusenoid patchy RPE dropout in the posterior pole, paramacula, no disciform scar, no exudates, no macular thickening, no hemorrhage Atrophy, Geographic atrophy, Advanced age related macular degeneration   Vessels Normal Old central retinal vein occlusion left eye, compensated, poor perfusion peripherally   Periphery Patchy geographic atrophy with nummular choroidal RPE dropout in the retinal peripherally superior and inferiorly fairly well intact nasally. Old PRP scars nasally patchy choriocapillaris dropout peripherally 360.          IMAGING AND PROCEDURES  Imaging and Procedures for 01/05/20  Color Fundus Photography Optos - OU - Both Eyes       Right Eye Progression has been stable. Disc findings include increased cup to disc ratio. Macula : drusen, geographic atrophy. Vessels : normal observations.   Left Eye Progression has been stable. Disc findings include increased cup to disc ratio, pallor, thinning of rim. Macula : geographic atrophy, drusen.   Notes OD with peripheral choriocapillaris RPE complex dropout superior and inferiorly.  This is not previous laser.  Dry  ARMD in a para macular area, not center involved at this time.  OS, old CRV O now collateralized at the nerve, no active vasculopathy.  Previous PRP nasal.  Diffuse RPE dropout, dry ARMD centrally         OCT, Retina - OU - Both Eyes       Right Eye Quality was good. Scan locations included subfoveal. Central Foveal Thickness: 177. Findings include  abnormal foveal contour, central retinal atrophy, outer retinal atrophy, preretinal fibrosis.   Left Eye Central Foveal Thickness: 141. Findings include central retinal atrophy, subretinal scarring, outer retinal atrophy.   Notes Diffuse retinal atrophy OD largely secondary to poor perfusion status of the choroid and potentially from RNFL atrophy as well.  No signs of active CNVM.  Sclerosis of the choriocapillaris layer, with para macular loss of photoreceptor layer  OS, diffuse macular atrophy, no active maculopathy OS                ASSESSMENT/PLAN:  Stable hemispheric central retinal vein occlusion (CRVO) of left eye Optic nerve collateralized and vision poor secondary to old CRV O, no active CME, observe.  Exudative age-related macular degeneration of right eye with inactive choroidal neovascularization (HCC) History of intravitreal Avastin right eye, last injection required, June 2017.  Primary open angle glaucoma of right eye, moderate stage Management of glaucoma OD as per Dr. Lottie Dawson      ICD-10-CM   1. Advanced nonexudative age-related macular degeneration of left eye with subfoveal involvement  H35.3124 Color Fundus Photography Optos - OU - Both Eyes    OCT, Retina - OU - Both Eyes  2. Advanced nonexudative age-related macular degeneration of right eye without subfoveal involvement  H35.3113 Color Fundus Photography Optos - OU - Both Eyes    OCT, Retina - OU - Both Eyes  3. Stable hemispheric central retinal vein occlusion (CRVO) of left eye  H34.8122   4. Exudative age-related macular degeneration of right eye with  inactive choroidal neovascularization (HCC)  H35.3212   5. Primary open angle glaucoma of right eye, moderate stage  H40.1112     1.  No intervention of retinal care required OU at this time.  We will continue to observe  2.  3.  Ophthalmic Meds Ordered this visit:  No orders of the defined types were placed in this encounter.      Return in about 6 months (around 07/04/2020) for DILATE OU, COLOR FP, OCT.  There are no Patient Instructions on file for this visit.   Explained the diagnoses, plan, and follow up with the patient and they expressed understanding.  Patient expressed understanding of the importance of proper follow up care.   Alford Highland Jariah Tarkowski M.D. Diseases & Surgery of the Retina and Vitreous [facility] 01/05/20     Abbreviations: M myopia (nearsighted); A astigmatism; H hyperopia (farsighted); P presbyopia; Mrx spectacle prescription;  CTL contact lenses; OD right eye; OS left eye; OU both eyes  XT exotropia; ET esotropia; PEK punctate epithelial keratitis; PEE punctate epithelial erosions; DES dry eye syndrome; MGD meibomian gland dysfunction; ATs artificial tears; PFAT's preservative free artificial tears; NSC nuclear sclerotic cataract; PSC posterior subcapsular cataract; ERM epi-retinal membrane; PVD posterior vitreous detachment; RD retinal detachment; DM diabetes mellitus; DR diabetic retinopathy; NPDR non-proliferative diabetic retinopathy; PDR proliferative diabetic retinopathy; CSME clinically significant macular edema; DME diabetic macular edema; dbh dot blot hemorrhages; CWS cotton wool spot; POAG primary open angle glaucoma; C/D cup-to-disc ratio; HVF humphrey visual field; GVF goldmann visual field; OCT optical coherence tomography; IOP intraocular pressure; BRVO Branch retinal vein occlusion; CRVO central retinal vein occlusion; CRAO central retinal artery occlusion; BRAO branch retinal artery occlusion; RT retinal tear; SB scleral buckle; PPV  pars plana vitrectomy; VH Vitreous hemorrhage; PRP panretinal laser photocoagulation; IVK intravitreal kenalog; VMT vitreomacular traction; MH Macular hole;  NVD neovascularization of the disc; NVE neovascularization elsewhere; AREDS age related eye disease study;  ARMD age related macular degeneration; POAG primary open angle glaucoma; EBMD epithelial/anterior basement membrane dystrophy; ACIOL anterior chamber intraocular lens; IOL intraocular lens; PCIOL posterior chamber intraocular lens; Phaco/IOL phacoemulsification with intraocular lens placement; [place] photorefractive keratectomy; LASIK laser assisted in situ keratomileusis; HTN hypertension; DM diabetes mellitus; COPD chronic obstructive pulmonary disease

## 2020-01-05 NOTE — Assessment & Plan Note (Signed)
Optic nerve collateralized and vision poor secondary to old CRV O, no active CME, observe.

## 2020-01-06 ENCOUNTER — Encounter (INDEPENDENT_AMBULATORY_CARE_PROVIDER_SITE_OTHER): Payer: Managed Care, Other (non HMO) | Admitting: Ophthalmology

## 2020-03-05 DIAGNOSIS — L989 Disorder of the skin and subcutaneous tissue, unspecified: Secondary | ICD-10-CM | POA: Diagnosis not present

## 2020-03-12 ENCOUNTER — Other Ambulatory Visit: Payer: Self-pay | Admitting: Internal Medicine

## 2020-03-12 DIAGNOSIS — Z1231 Encounter for screening mammogram for malignant neoplasm of breast: Secondary | ICD-10-CM

## 2020-03-28 DIAGNOSIS — H401113 Primary open-angle glaucoma, right eye, severe stage: Secondary | ICD-10-CM | POA: Diagnosis not present

## 2020-03-28 DIAGNOSIS — H35363 Drusen (degenerative) of macula, bilateral: Secondary | ICD-10-CM | POA: Diagnosis not present

## 2020-03-28 DIAGNOSIS — H401123 Primary open-angle glaucoma, left eye, severe stage: Secondary | ICD-10-CM | POA: Diagnosis not present

## 2020-04-05 ENCOUNTER — Other Ambulatory Visit: Payer: Self-pay

## 2020-04-05 ENCOUNTER — Ambulatory Visit
Admission: RE | Admit: 2020-04-05 | Discharge: 2020-04-05 | Disposition: A | Discharge status: Home / Self Care | Payer: Medicare Other | Source: Ambulatory Visit | Attending: Internal Medicine | Admitting: Internal Medicine

## 2020-04-05 DIAGNOSIS — Z1231 Encounter for screening mammogram for malignant neoplasm of breast: Secondary | ICD-10-CM | POA: Diagnosis not present

## 2020-04-16 DIAGNOSIS — Z853 Personal history of malignant neoplasm of breast: Secondary | ICD-10-CM | POA: Diagnosis not present

## 2020-04-16 DIAGNOSIS — I10 Essential (primary) hypertension: Secondary | ICD-10-CM | POA: Diagnosis not present

## 2020-04-16 DIAGNOSIS — K219 Gastro-esophageal reflux disease without esophagitis: Secondary | ICD-10-CM | POA: Diagnosis not present

## 2020-04-16 DIAGNOSIS — R7309 Other abnormal glucose: Secondary | ICD-10-CM | POA: Diagnosis not present

## 2020-04-16 DIAGNOSIS — E782 Mixed hyperlipidemia: Secondary | ICD-10-CM | POA: Diagnosis not present

## 2020-04-16 DIAGNOSIS — M810 Age-related osteoporosis without current pathological fracture: Secondary | ICD-10-CM | POA: Diagnosis not present

## 2020-04-23 ENCOUNTER — Ambulatory Visit: Payer: Medicare Other

## 2020-05-23 DIAGNOSIS — H524 Presbyopia: Secondary | ICD-10-CM | POA: Diagnosis not present

## 2020-06-04 ENCOUNTER — Encounter (INDEPENDENT_AMBULATORY_CARE_PROVIDER_SITE_OTHER): Payer: Self-pay

## 2020-06-04 DIAGNOSIS — H35363 Drusen (degenerative) of macula, bilateral: Secondary | ICD-10-CM | POA: Diagnosis not present

## 2020-06-04 DIAGNOSIS — H401113 Primary open-angle glaucoma, right eye, severe stage: Secondary | ICD-10-CM | POA: Diagnosis not present

## 2020-06-04 DIAGNOSIS — H401123 Primary open-angle glaucoma, left eye, severe stage: Secondary | ICD-10-CM | POA: Diagnosis not present

## 2020-06-25 DIAGNOSIS — L237 Allergic contact dermatitis due to plants, except food: Secondary | ICD-10-CM | POA: Diagnosis not present

## 2020-07-01 DIAGNOSIS — U071 COVID-19: Secondary | ICD-10-CM | POA: Diagnosis not present

## 2020-07-01 DIAGNOSIS — R059 Cough, unspecified: Secondary | ICD-10-CM | POA: Diagnosis not present

## 2020-07-01 DIAGNOSIS — J069 Acute upper respiratory infection, unspecified: Secondary | ICD-10-CM | POA: Diagnosis not present

## 2020-07-03 DIAGNOSIS — U071 COVID-19: Secondary | ICD-10-CM | POA: Diagnosis not present

## 2020-07-05 ENCOUNTER — Encounter (INDEPENDENT_AMBULATORY_CARE_PROVIDER_SITE_OTHER): Payer: Medicare Other | Admitting: Ophthalmology

## 2020-07-09 ENCOUNTER — Ambulatory Visit (INDEPENDENT_AMBULATORY_CARE_PROVIDER_SITE_OTHER): Payer: Medicare Other | Admitting: Ophthalmology

## 2020-07-09 ENCOUNTER — Encounter (INDEPENDENT_AMBULATORY_CARE_PROVIDER_SITE_OTHER): Payer: Self-pay | Admitting: Ophthalmology

## 2020-07-09 ENCOUNTER — Other Ambulatory Visit: Payer: Self-pay

## 2020-07-09 DIAGNOSIS — H353212 Exudative age-related macular degeneration, right eye, with inactive choroidal neovascularization: Secondary | ICD-10-CM | POA: Diagnosis not present

## 2020-07-09 DIAGNOSIS — H348122 Central retinal vein occlusion, left eye, stable: Secondary | ICD-10-CM | POA: Diagnosis not present

## 2020-07-09 DIAGNOSIS — H353113 Nonexudative age-related macular degeneration, right eye, advanced atrophic without subfoveal involvement: Secondary | ICD-10-CM

## 2020-07-09 DIAGNOSIS — H353124 Nonexudative age-related macular degeneration, left eye, advanced atrophic with subfoveal involvement: Secondary | ICD-10-CM | POA: Diagnosis not present

## 2020-07-09 DIAGNOSIS — H401123 Primary open-angle glaucoma, left eye, severe stage: Secondary | ICD-10-CM | POA: Diagnosis not present

## 2020-07-09 DIAGNOSIS — H401112 Primary open-angle glaucoma, right eye, moderate stage: Secondary | ICD-10-CM | POA: Diagnosis not present

## 2020-07-09 NOTE — Assessment & Plan Note (Signed)
No signs of recurrent CNVM OD 

## 2020-07-09 NOTE — Assessment & Plan Note (Signed)
Collateralized vessels on the optic nerve and now compensated CRV O OS, no active ischemia no macular edema

## 2020-07-09 NOTE — Assessment & Plan Note (Signed)
Continue the medications as per Dr. Hilbert Corrigan

## 2020-07-09 NOTE — Assessment & Plan Note (Signed)
Irregular foveal OD with subfoveal drusen and diffuse macular atrophy yet with preserved visual acuity.

## 2020-07-09 NOTE — Assessment & Plan Note (Signed)
Continue medications as instructed by Dr. Hilbert Corrigan

## 2020-07-09 NOTE — Assessment & Plan Note (Signed)
OS in conjunction with geographic atrophy of the fovea from prior ischemic CRV O yet also with dry ARMD accounts for acuity

## 2020-07-09 NOTE — Progress Notes (Signed)
07/09/2020     CHIEF COMPLAINT Patient presents for Retina Follow Up (23month fu ou. OCT/ FP/Pt states, "My va seems to be a little bit decreased. I saw Dr. Lottie Dawson and he gave me glasses but they do not help."/Pt reports using Brimonidine BID OU, Cosopt BID , Rocklatan QHS OU and Pilocarpine BID OD.//A1C: 6.1/LBS: Does not check)   HISTORY OF PRESENT ILLNESS: Tami Mercado is a 80 y.o. female who presents to the clinic today for:   HPI    Retina Follow Up    Diagnosis: Dry AMD   Laterality: left eye   Onset: 6 months ago   Duration: 6 months   Course: gradually worsening   Comments: 54month fu ou. OCT/ FP Pt states, "My va seems to be a little bit decreased. I saw Dr. Lottie Dawson and he gave me glasses but they do not help." Pt reports using Brimonidine BID OU, Cosopt BID , Rocklatan QHS OU and Pilocarpine BID OD.  A1C: 6.1 LBS: Does not check       Last edited by Demetrios Loll, COA on 07/09/2020  9:01 AM. (History)      Referring physician: Georgann Housekeeper, MD [address] [place],  [place] [address]  HISTORICAL INFORMATION:   Selected notes from the MEDICAL RECORD NUMBER       CURRENT MEDICATIONS: Current Outpatient Medications (Ophthalmic Drugs)  Medication Sig  . brimonidine (ALPHAGAN P) 0.1 % SOLN   . dorzolamide (TRUSOPT) 2 % ophthalmic solution 1 drop 3 (three) times daily.  . dorzolamide-timolol (COSOPT) 22.3-6.8 MG/ML ophthalmic solution Place 1 drop into both eyes every 12 hours.  . Netarsudil-Latanoprost (ROCKLATAN) 0.02-0.005 % SOLN Place 1 drop into the right eye daily.  . pilocarpine (PILOCAR) 1 % ophthalmic solution Place 1 drop into the right eye 3 times daily.   No current facility-administered medications for this visit. (Ophthalmic Drugs)   Current Outpatient Medications (Other)  Medication Sig  . amLODipine (NORVASC) 10 MG tablet Take 10 mg by mouth daily.  . fenofibrate (TRICOR) 145 MG tablet Take 145 mg by mouth daily.  .  hydrochlorothiazide (HYDRODIURIL) 12.5 MG tablet Take 12.5 mg by mouth daily.  Marland Kitchen losartan-hydrochlorothiazide (HYZAAR) 100-12.5 MG tablet Take 1 tablet by mouth daily.  . metFORMIN (GLUCOPHAGE) 500 MG tablet Take 500 mg by mouth daily.  . Multiple Vitamins-Minerals (EYE VITAMINS PO) Take 1 tablet by mouth daily.  . Omega-3 Fatty Acids (FISH OIL) 1000 MG CAPS Take by mouth.   No current facility-administered medications for this visit. (Other)      REVIEW OF SYSTEMS:    ALLERGIES Allergies  Allergen Reactions  . Amoxicillin Other (See Comments)    Upsets stomach    PAST MEDICAL HISTORY Past Medical History:  Diagnosis Date  . Breast mass, right   . Glaucoma   . Hyperlipemia   . Hypertension    Past Surgical History:  Procedure Laterality Date  . BREAST EXCISIONAL BIOPSY Right   . BREAST LUMPECTOMY WITH RADIOACTIVE SEED LOCALIZATION Right 06/11/2016   Procedure: BREAST LUMPECTOMY WITH RADIOACTIVE SEED LOCALIZATION;  Surgeon: Chevis Pretty III, MD;  Location: [facility];  Service: General;  Laterality: Right;  . COLONOSCOPY WITH PROPOFOL N/A 03/01/2015   Procedure: COLONOSCOPY WITH PROPOFOL;  Surgeon: Charolett Bumpers, MD;  Location: [facility];  Service: Endoscopy;  Laterality: N/A;  . EYE SURGERY      FAMILY HISTORY Family History  Problem Relation Age of Onset  . Hypertension Mother  SOCIAL HISTORY Social History   Tobacco Use  . Smoking status: Never Smoker  . Smokeless tobacco: Never Used  Substance Use Topics  . Alcohol use: No  . Drug use: No         OPHTHALMIC EXAM: Base Eye Exam    Visual Acuity (ETDRS)      Right Left   Dist [place] 20/40 20/400   Dist ph [place] NI NI       Tonometry (Tonopen, 9:05 AM)      Right Left   Pressure 09 11       Pupils      Dark Light Shape React APD   Right 2 2 Round Minimal None   Left 3 3 Round Minimal +2       Visual Fields      Left Right     Full   Restrictions Partial outer superior  temporal, superior nasal, inferior nasal deficiencies        Neuro/Psych    Oriented x3: Yes   Mood/Affect: Normal       Dilation    Both eyes: 1.0% Mydriacyl, 2.5% Phenylephrine @ 9:05 AM        Slit Lamp and Fundus Exam    External Exam      Right Left   External Normal Normal       Slit Lamp Exam      Right Left   Lids/Lashes Normal Normal   Conjunctiva/Sclera White and quiet White and quiet   Cornea Clear Clear   Anterior Chamber Deep and quiet Deep and quiet   Iris Round and reactive Round and reactive   Lens Posterior chamber intraocular lens Posterior chamber intraocular lens   Anterior Vitreous Normal Normal       Fundus Exam      Right Left   Posterior Vitreous Normal Normal   Disc Normal 2+ Pallor, old collaterals.   C/D Ratio 0.35 0.9   Macula Atrophy, Geographic atrophy, Advanced age related macular degeneration, calcified atrophic drusenoid patchy RPE dropout in the posterior pole, paramacula, no disciform scar, no exudates, no macular thickening, no hemorrhage Atrophy, Geographic atrophy, Advanced age related macular degeneration   Vessels Normal Old central retinal vein occlusion left eye, compensated, poor perfusion peripherally   Periphery Patchy geographic atrophy with nummular choroidal RPE dropout in the retinal peripherally superior and inferiorly fairly well intact nasally. Old PRP scars nasally patchy choriocapillaris dropout peripherally 360.          IMAGING AND PROCEDURES  Imaging and Procedures for 07/09/20  OCT, Retina - OU - Both Eyes       Right Eye Quality was good. Scan locations included subfoveal. Central Foveal Thickness: 182. Progression has been stable. Findings include abnormal foveal contour, central retinal atrophy, outer retinal atrophy, preretinal fibrosis, retinal drusen , no IRF, no SRF.   Left Eye Quality was good. Scan locations included subfoveal. Central Foveal Thickness: 121. Progression has been stable. Findings  include central retinal atrophy, subretinal scarring, outer retinal atrophy, abnormal foveal contour.   Notes Diffuse retinal atrophy OD largely secondary to poor perfusion status of the choroid and potentially from RNFL atrophy as well.  No signs of active CNVM.  Sclerosis of the choriocapillaris layer, with para macular loss of photoreceptor layer  OS, diffuse macular atrophy, no active maculopathy OS       Color Fundus Photography Optos - OU - Both Eyes       Right Eye Disc findings include increased cup  to disc ratio. Macula : drusen, geographic atrophy. Vessels : normal observations.   Left Eye Progression has been stable. Disc findings include increased cup to disc ratio, pallor, thinning of rim. Macula : geographic atrophy, drusen.   Notes OD with peripheral choriocapillaris RPE complex dropout superior and inferiorly.  This is not previous laser.  Dry ARMD in a para macular area, not center involved at this time.  OS, old CRV O now collateralized at the nerve, no active vasculopathy.  Previous PRP nasal.  Diffuse RPE dropout, dry ARMD centrally                  ASSESSMENT/PLAN:  Advanced nonexudative age-related macular degeneration of left eye with subfoveal involvement OS in conjunction with geographic atrophy of the fovea from prior ischemic CRV O yet also with dry ARMD accounts for acuity  Advanced nonexudative age-related macular degeneration of right eye without subfoveal involvement Irregular foveal OD with subfoveal drusen and diffuse macular atrophy yet with preserved visual acuity.  Exudative age-related macular degeneration of right eye with inactive choroidal neovascularization (HCC) No signs of recurrent CNVM OD  Stable hemispheric central retinal vein occlusion (CRVO) of left eye Collateralized vessels on the optic nerve and now compensated CRV O OS, no active ischemia no macular edema  Primary open angle glaucoma of left eye, severe  stage Continue medications as instructed by Dr. Hilbert Corrigan  Primary open-angle glaucoma, right eye, moderate stage Continue the medications as per Dr. Hilbert Corrigan      ICD-10-CM   1. Advanced nonexudative age-related macular degeneration of left eye with subfoveal involvement  H35.3124 OCT, Retina - OU - Both Eyes    Color Fundus Photography Optos - OU - Both Eyes  2. Advanced nonexudative age-related macular degeneration of right eye without subfoveal involvement  H35.3113   3. Exudative age-related macular degeneration of right eye with inactive choroidal neovascularization (HCC)  H35.3212   4. Stable hemispheric central retinal vein occlusion (CRVO) of left eye  H34.8122   5. Primary open angle glaucoma of left eye, severe stage  H40.1123   6. Primary open-angle glaucoma, right eye, moderate stage  H40.1112     1.  2.  3.  Ophthalmic Meds Ordered this visit:  No orders of the defined types were placed in this encounter.      Return in about 6 months (around 01/09/2021) for COLOR FP, OCT.  There are no Patient Instructions on file for this visit.   Explained the diagnoses, plan, and follow up with the patient and they expressed understanding.  Patient expressed understanding of the importance of proper follow up care.   Alford Highland Mckenzi Buonomo M.D. Diseases & Surgery of the Retina and Vitreous [facility] 07/09/20     Abbreviations: M myopia (nearsighted); A astigmatism; H hyperopia (farsighted); P presbyopia; Mrx spectacle prescription;  CTL contact lenses; OD right eye; OS left eye; OU both eyes  XT exotropia; ET esotropia; PEK punctate epithelial keratitis; PEE punctate epithelial erosions; DES dry eye syndrome; MGD meibomian gland dysfunction; ATs artificial tears; PFAT's preservative free artificial tears; NSC nuclear sclerotic cataract; PSC posterior subcapsular cataract; ERM epi-retinal membrane; PVD posterior vitreous detachment; RD retinal detachment; DM  diabetes mellitus; DR diabetic retinopathy; NPDR non-proliferative diabetic retinopathy; PDR proliferative diabetic retinopathy; CSME clinically significant macular edema; DME diabetic macular edema; dbh dot blot hemorrhages; CWS cotton wool spot; POAG primary open angle glaucoma; C/D cup-to-disc ratio; HVF humphrey visual field; GVF goldmann visual field; OCT optical  coherence tomography; IOP intraocular pressure; BRVO Branch retinal vein occlusion; CRVO central retinal vein occlusion; CRAO central retinal artery occlusion; BRAO branch retinal artery occlusion; RT retinal tear; SB scleral buckle; PPV pars plana vitrectomy; VH Vitreous hemorrhage; PRP panretinal laser photocoagulation; IVK intravitreal kenalog; VMT vitreomacular traction; MH Macular hole;  NVD neovascularization of the disc; NVE neovascularization elsewhere; AREDS age related eye disease study; ARMD age related macular degeneration; POAG primary open angle glaucoma; EBMD epithelial/anterior basement membrane dystrophy; ACIOL anterior chamber intraocular lens; IOL intraocular lens; PCIOL posterior chamber intraocular lens; Phaco/IOL phacoemulsification with intraocular lens placement; [place] photorefractive keratectomy; LASIK laser assisted in situ keratomileusis; HTN hypertension; DM diabetes mellitus; COPD chronic obstructive pulmonary disease

## 2020-08-30 DIAGNOSIS — Z7984 Long term (current) use of oral hypoglycemic drugs: Secondary | ICD-10-CM | POA: Diagnosis not present

## 2020-08-30 DIAGNOSIS — Z853 Personal history of malignant neoplasm of breast: Secondary | ICD-10-CM | POA: Diagnosis not present

## 2020-08-30 DIAGNOSIS — I10 Essential (primary) hypertension: Secondary | ICD-10-CM | POA: Diagnosis not present

## 2020-08-30 DIAGNOSIS — E782 Mixed hyperlipidemia: Secondary | ICD-10-CM | POA: Diagnosis not present

## 2020-08-30 DIAGNOSIS — M810 Age-related osteoporosis without current pathological fracture: Secondary | ICD-10-CM | POA: Diagnosis not present

## 2020-08-30 DIAGNOSIS — R7303 Prediabetes: Secondary | ICD-10-CM | POA: Diagnosis not present

## 2020-09-27 DIAGNOSIS — H401113 Primary open-angle glaucoma, right eye, severe stage: Secondary | ICD-10-CM | POA: Diagnosis not present

## 2020-09-27 DIAGNOSIS — H401123 Primary open-angle glaucoma, left eye, severe stage: Secondary | ICD-10-CM | POA: Diagnosis not present

## 2020-09-27 DIAGNOSIS — H35363 Drusen (degenerative) of macula, bilateral: Secondary | ICD-10-CM | POA: Diagnosis not present

## 2020-11-12 DIAGNOSIS — H401123 Primary open-angle glaucoma, left eye, severe stage: Secondary | ICD-10-CM | POA: Diagnosis not present

## 2020-11-12 DIAGNOSIS — H401113 Primary open-angle glaucoma, right eye, severe stage: Secondary | ICD-10-CM | POA: Diagnosis not present

## 2020-11-20 DIAGNOSIS — M1990 Unspecified osteoarthritis, unspecified site: Secondary | ICD-10-CM | POA: Diagnosis not present

## 2020-12-14 DIAGNOSIS — H409 Unspecified glaucoma: Secondary | ICD-10-CM | POA: Diagnosis not present

## 2020-12-14 DIAGNOSIS — M810 Age-related osteoporosis without current pathological fracture: Secondary | ICD-10-CM | POA: Diagnosis not present

## 2020-12-14 DIAGNOSIS — Z853 Personal history of malignant neoplasm of breast: Secondary | ICD-10-CM | POA: Diagnosis not present

## 2020-12-14 DIAGNOSIS — E782 Mixed hyperlipidemia: Secondary | ICD-10-CM | POA: Diagnosis not present

## 2020-12-14 DIAGNOSIS — Z23 Encounter for immunization: Secondary | ICD-10-CM | POA: Diagnosis not present

## 2020-12-14 DIAGNOSIS — Z0000 Encounter for general adult medical examination without abnormal findings: Secondary | ICD-10-CM | POA: Diagnosis not present

## 2020-12-14 DIAGNOSIS — I10 Essential (primary) hypertension: Secondary | ICD-10-CM | POA: Diagnosis not present

## 2020-12-14 DIAGNOSIS — Z1389 Encounter for screening for other disorder: Secondary | ICD-10-CM | POA: Diagnosis not present

## 2020-12-14 DIAGNOSIS — R7303 Prediabetes: Secondary | ICD-10-CM | POA: Diagnosis not present

## 2021-01-02 ENCOUNTER — Encounter (INDEPENDENT_AMBULATORY_CARE_PROVIDER_SITE_OTHER): Payer: Self-pay | Admitting: Ophthalmology

## 2021-01-02 ENCOUNTER — Other Ambulatory Visit: Payer: Self-pay

## 2021-01-02 ENCOUNTER — Ambulatory Visit (INDEPENDENT_AMBULATORY_CARE_PROVIDER_SITE_OTHER): Payer: Medicare Other | Admitting: Ophthalmology

## 2021-01-02 DIAGNOSIS — H348122 Central retinal vein occlusion, left eye, stable: Secondary | ICD-10-CM

## 2021-01-02 DIAGNOSIS — H353113 Nonexudative age-related macular degeneration, right eye, advanced atrophic without subfoveal involvement: Secondary | ICD-10-CM | POA: Diagnosis not present

## 2021-01-02 DIAGNOSIS — H353124 Nonexudative age-related macular degeneration, left eye, advanced atrophic with subfoveal involvement: Secondary | ICD-10-CM | POA: Diagnosis not present

## 2021-01-02 NOTE — Assessment & Plan Note (Signed)
Subfoveal atrophy of the left eye accounts for acuity left eye

## 2021-01-02 NOTE — Assessment & Plan Note (Signed)
OD with progressive geographic atrophy and calcific drusen intraretinal foveal and parafoveal region.  Still with excellent and stable acuity OD yet patient now requires magnifying assistance

## 2021-01-02 NOTE — Progress Notes (Signed)
01/02/2021     CHIEF COMPLAINT Patient presents for  Chief Complaint  Patient presents with   Retina Follow Up      HISTORY OF PRESENT ILLNESS: Tami Mercado is a 80 y.o. female who presents to the clinic today for:   HPI     Retina Follow Up   Patient presents with  Dry AMD.  In both eyes.  This started 5 months ago.  Duration of 5 months.  Since onset it is gradually worsening (Right eye).        Comments   Pt c/o new visual distortion with the right eye.  EyeMeds: Brimonidine BID OU Trusopt BID OU Cosopt QHS OU Rocklatan QHS OU      Last edited by Frederik Pear, COA on 01/02/2021  9:34 AM.      Referring physician: Georgann Housekeeper, MD [address] [place],  [place] [address]  HISTORICAL INFORMATION:   Selected notes from the MEDICAL RECORD NUMBER       CURRENT MEDICATIONS: Current Outpatient Medications (Ophthalmic Drugs)  Medication Sig   brimonidine (ALPHAGAN P) 0.1 % SOLN    dorzolamide (TRUSOPT) 2 % ophthalmic solution 1 drop 3 (three) times daily.   dorzolamide-timolol (COSOPT) 22.3-6.8 MG/ML ophthalmic solution Place 1 drop into both eyes every 12 hours.   Netarsudil-Latanoprost (ROCKLATAN) 0.02-0.005 % SOLN Place 1 drop into the right eye daily.   No current facility-administered medications for this visit. (Ophthalmic Drugs)   Current Outpatient Medications (Other)  Medication Sig   amLODipine (NORVASC) 10 MG tablet Take 10 mg by mouth daily.   fenofibrate (TRICOR) 145 MG tablet Take 145 mg by mouth daily.   hydrochlorothiazide (HYDRODIURIL) 12.5 MG tablet Take 12.5 mg by mouth daily.   losartan-hydrochlorothiazide (HYZAAR) 100-12.5 MG tablet Take 1 tablet by mouth daily.   metFORMIN (GLUCOPHAGE) 500 MG tablet Take 500 mg by mouth daily.   Multiple Vitamins-Minerals (EYE VITAMINS PO) Take 1 tablet by mouth daily.   Omega-3 Fatty Acids (FISH OIL) 1000 MG CAPS Take by mouth.   No current facility-administered medications  for this visit. (Other)      REVIEW OF SYSTEMS:    ALLERGIES Allergies  Allergen Reactions   Amoxicillin Other (See Comments)    Upsets stomach    PAST MEDICAL HISTORY Past Medical History:  Diagnosis Date   Breast mass, right    Glaucoma    Hyperlipemia    Hypertension    Past Surgical History:  Procedure Laterality Date   BREAST EXCISIONAL BIOPSY Right    BREAST LUMPECTOMY WITH RADIOACTIVE SEED LOCALIZATION Right 06/11/2016   Procedure: BREAST LUMPECTOMY WITH RADIOACTIVE SEED LOCALIZATION;  Surgeon: Chevis Pretty III, MD;  Location: [facility];  Service: General;  Laterality: Right;   COLONOSCOPY WITH PROPOFOL N/A 03/01/2015   Procedure: COLONOSCOPY WITH PROPOFOL;  Surgeon: Charolett Bumpers, MD;  Location: [facility];  Service: Endoscopy;  Laterality: N/A;   EYE SURGERY      FAMILY HISTORY Family History  Problem Relation Age of Onset   Hypertension Mother     SOCIAL HISTORY Social History   Tobacco Use   Smoking status: Never   Smokeless tobacco: Never  Substance Use Topics   Alcohol use: No   Drug use: No         OPHTHALMIC EXAM:  Base Eye Exam     Visual Acuity (ETDRS)       Right Left   Dist [place] 20/40 +2 20/200  Dist ph [place] NI 20/160 +1         Tonometry (Tonopen, 9:39 AM)       Right Left   Pressure 18 10         Pupils       Dark Light Shape React APD   Right 2 2 Round Minimal None   Left 3 3 Round Minimal None         Visual Fields (Counting fingers)       Left Right     Full   Restrictions Partial outer superior temporal, superior nasal, inferior nasal deficiencies          Extraocular Movement       Right Left    Full, Ortho Full, Ortho         Neuro/Psych     Oriented x3: Yes   Mood/Affect: Normal         Dilation     Both eyes: 1.0% Mydriacyl, 2.5% Phenylephrine @ 9:39 AM           Slit Lamp and Fundus Exam     External Exam       Right Left   External Normal Normal          Slit Lamp Exam       Right Left   Lids/Lashes Normal Normal   Conjunctiva/Sclera White and quiet White and quiet   Cornea Clear Clear   Anterior Chamber Deep and quiet Deep and quiet   Iris Round and reactive Round and reactive   Lens Posterior chamber intraocular lens Posterior chamber intraocular lens   Anterior Vitreous Normal Normal         Fundus Exam       Right Left   Posterior Vitreous Normal Normal   Disc Normal 2+ Pallor, old collaterals.   C/D Ratio 0.35 0.9   Macula Atrophy, Geographic atrophy, Advanced age related macular degeneration, calcified atrophic drusenoid patchy RPE dropout in the posterior pole, paramacula, no disciform scar, no exudates, no macular thickening, no hemorrhage Atrophy, Geographic atrophy, Advanced age related macular degeneration   Vessels Normal Old central retinal vein occlusion left eye, compensated, poor perfusion peripherally   Periphery Patchy geographic atrophy with nummular choroidal RPE dropout in the retinal peripherally superior and inferiorly fairly well intact nasally. Old PRP scars nasally patchy choriocapillaris dropout peripherally 360.            IMAGING AND PROCEDURES  Imaging and Procedures for 01/02/21  OCT, Retina - OU - Both Eyes       Right Eye Quality was good. Scan locations included subfoveal. Central Foveal Thickness: 194. Progression has been stable. Findings include abnormal foveal contour, central retinal atrophy, outer retinal atrophy, preretinal fibrosis, retinal drusen , no IRF, no SRF.   Left Eye Quality was good. Scan locations included subfoveal. Central Foveal Thickness: 184. Progression has been stable. Findings include central retinal atrophy, subretinal scarring, outer retinal atrophy, abnormal foveal contour.   Notes Diffuse retinal atrophy OD largely secondary to poor perfusion status of the choroid and potentially from RNFL atrophy as well.  No signs of active CNVM.  Sclerosis of the  choriocapillaris layer, with para macular loss of photoreceptor layer  OS, diffuse macular atrophy, no active maculopathy OS     Color Fundus Photography Optos - OU - Both Eyes       Right Eye Disc findings include increased cup to disc ratio. Macula : drusen, geographic atrophy. Vessels : normal observations.  Left Eye Progression has been stable. Disc findings include increased cup to disc ratio, pallor, thinning of rim. Macula : geographic atrophy, drusen. Vessels : normal observations. Periphery : normal observations.   Notes OD with peripheral choriocapillaris RPE complex dropout superior and inferiorly.  This is not previous laser.  Dry ARMD in a paramacular area, juxta foveal center involved at this time.  OS, old CRVO now collateralized at the nerve, no active vasculopathy.  Previous PRP nasal.  Diffuse RPE dropout, dry ARMD centrally               ASSESSMENT/PLAN:  Advanced nonexudative age-related macular degeneration of right eye without subfoveal involvement OD with progressive geographic atrophy and calcific drusen intraretinal foveal and parafoveal region.  Still with excellent and stable acuity OD yet patient now requires magnifying assistance  Advanced nonexudative age-related macular degeneration of left eye with subfoveal involvement Subfoveal atrophy of the left eye accounts for acuity left eye  Stable hemispheric central retinal vein occlusion (CRVO) of left eye Prior CRV O ischemic OS now resolved     ICD-10-CM   1. Advanced nonexudative age-related macular degeneration of left eye with subfoveal involvement  H35.3124 OCT, Retina - OU - Both Eyes    Color Fundus Photography Optos - OU - Both Eyes    2. Advanced nonexudative age-related macular degeneration of right eye without subfoveal involvement  H35.3113     3. Stable hemispheric central retinal vein occlusion (CRVO) of left eye  H34.8122       1.  OU, stable overall.  No signs of wet AMD  OU.  We will continue to observe  2.  RV as needed new onset symptoms or distortion in either eye  3.  Ophthalmic Meds Ordered this visit:  No orders of the defined types were placed in this encounter.      Return in about 6 months (around 07/02/2021) for DILATE OU, OCT.  There are no Patient Instructions on file for this visit.   Explained the diagnoses, plan, and follow up with the patient and they expressed understanding.  Patient expressed understanding of the importance of proper follow up care.   Alford Highland Kandee Escalante M.D. Diseases & Surgery of the Retina and Vitreous [facility] 01/02/21     Abbreviations: M myopia (nearsighted); A astigmatism; H hyperopia (farsighted); P presbyopia; Mrx spectacle prescription;  CTL contact lenses; OD right eye; OS left eye; OU both eyes  XT exotropia; ET esotropia; PEK punctate epithelial keratitis; PEE punctate epithelial erosions; DES dry eye syndrome; MGD meibomian gland dysfunction; ATs artificial tears; PFAT's preservative free artificial tears; NSC nuclear sclerotic cataract; PSC posterior subcapsular cataract; ERM epi-retinal membrane; PVD posterior vitreous detachment; RD retinal detachment; DM diabetes mellitus; DR diabetic retinopathy; NPDR non-proliferative diabetic retinopathy; PDR proliferative diabetic retinopathy; CSME clinically significant macular edema; DME diabetic macular edema; dbh dot blot hemorrhages; CWS cotton wool spot; POAG primary open angle glaucoma; C/D cup-to-disc ratio; HVF humphrey visual field; GVF goldmann visual field; OCT optical coherence tomography; IOP intraocular pressure; BRVO Branch retinal vein occlusion; CRVO central retinal vein occlusion; CRAO central retinal artery occlusion; BRAO branch retinal artery occlusion; RT retinal tear; SB scleral buckle; PPV pars plana vitrectomy; VH Vitreous hemorrhage; PRP panretinal laser photocoagulation; IVK intravitreal kenalog; VMT vitreomacular traction; MH  Macular hole;  NVD neovascularization of the disc; NVE neovascularization elsewhere; AREDS age related eye disease study; ARMD age related macular degeneration; POAG primary open angle glaucoma; EBMD epithelial/anterior basement membrane dystrophy; ACIOL  anterior chamber intraocular lens; IOL intraocular lens; PCIOL posterior chamber intraocular lens; Phaco/IOL phacoemulsification with intraocular lens placement; [place] photorefractive keratectomy; LASIK laser assisted in situ keratomileusis; HTN hypertension; DM diabetes mellitus; COPD chronic obstructive pulmonary disease

## 2021-01-02 NOTE — Assessment & Plan Note (Signed)
Prior CRV O ischemic OS now resolved

## 2021-01-14 ENCOUNTER — Encounter (INDEPENDENT_AMBULATORY_CARE_PROVIDER_SITE_OTHER): Payer: Medicare Other | Admitting: Ophthalmology

## 2021-01-14 ENCOUNTER — Encounter (INDEPENDENT_AMBULATORY_CARE_PROVIDER_SITE_OTHER): Payer: Self-pay

## 2021-01-14 DIAGNOSIS — H401113 Primary open-angle glaucoma, right eye, severe stage: Secondary | ICD-10-CM | POA: Diagnosis not present

## 2021-01-14 DIAGNOSIS — H401123 Primary open-angle glaucoma, left eye, severe stage: Secondary | ICD-10-CM | POA: Diagnosis not present

## 2021-01-30 DIAGNOSIS — I10 Essential (primary) hypertension: Secondary | ICD-10-CM | POA: Diagnosis not present

## 2021-03-13 DIAGNOSIS — I10 Essential (primary) hypertension: Secondary | ICD-10-CM | POA: Diagnosis not present

## 2021-04-11 ENCOUNTER — Ambulatory Visit (INDEPENDENT_AMBULATORY_CARE_PROVIDER_SITE_OTHER): Payer: Medicare Other | Admitting: Ophthalmology

## 2021-04-11 ENCOUNTER — Encounter (INDEPENDENT_AMBULATORY_CARE_PROVIDER_SITE_OTHER): Payer: Medicare Other | Admitting: Ophthalmology

## 2021-04-11 ENCOUNTER — Encounter (INDEPENDENT_AMBULATORY_CARE_PROVIDER_SITE_OTHER): Payer: Self-pay | Admitting: Ophthalmology

## 2021-04-11 ENCOUNTER — Other Ambulatory Visit: Payer: Self-pay

## 2021-04-11 DIAGNOSIS — H353124 Nonexudative age-related macular degeneration, left eye, advanced atrophic with subfoveal involvement: Secondary | ICD-10-CM | POA: Diagnosis not present

## 2021-04-11 DIAGNOSIS — H353212 Exudative age-related macular degeneration, right eye, with inactive choroidal neovascularization: Secondary | ICD-10-CM

## 2021-04-11 DIAGNOSIS — H353113 Nonexudative age-related macular degeneration, right eye, advanced atrophic without subfoveal involvement: Secondary | ICD-10-CM

## 2021-04-11 NOTE — Assessment & Plan Note (Signed)
Some encroachment into the macular region

## 2021-04-11 NOTE — Assessment & Plan Note (Signed)
OS, geographic atrophy in the fovea no change no sign of CNVM OU

## 2021-04-11 NOTE — Assessment & Plan Note (Signed)
No active CNVM 

## 2021-04-11 NOTE — Progress Notes (Signed)
04/11/2021     CHIEF COMPLAINT Patient presents for  Chief Complaint  Patient presents with   Eye Problem      HISTORY OF PRESENT ILLNESS: Tami Mercado is a 81 y.o. female who presents to the clinic today for:   HPI   WIP- cloudiness/ decrease in vision x3 weeks. Pt states "vision has decreased. During the day time I am having a little bit of trouble driving with the sunlight. Lately I can't see the road signs clearly. It is foggy and cloudy. It has been getting worse within the last 3 weeks." Pt states she is using Brimonidine BID OU, Dorz-tim OU BID. Pt denies new FOL or floaters. Last edited by Nelva Nay on 04/11/2021  2:41 PM.      Referring physician: Georgann Housekeeper, MD [address] [place],  [place] [address]  HISTORICAL INFORMATION:   Selected notes from the MEDICAL RECORD NUMBER       CURRENT MEDICATIONS: Current Outpatient Medications (Ophthalmic Drugs)  Medication Sig   brimonidine (ALPHAGAN P) 0.1 % SOLN    dorzolamide (TRUSOPT) 2 % ophthalmic solution 1 drop 3 (three) times daily.   dorzolamide-timolol (COSOPT) 22.3-6.8 MG/ML ophthalmic solution Place 1 drop into both eyes every 12 hours.   Netarsudil-Latanoprost (ROCKLATAN) 0.02-0.005 % SOLN Place 1 drop into the right eye daily.   No current facility-administered medications for this visit. (Ophthalmic Drugs)   Current Outpatient Medications (Other)  Medication Sig   amLODipine (NORVASC) 10 MG tablet Take 10 mg by mouth daily.   fenofibrate (TRICOR) 145 MG tablet Take 145 mg by mouth daily.   hydrochlorothiazide (HYDRODIURIL) 12.5 MG tablet Take 12.5 mg by mouth daily.   losartan-hydrochlorothiazide (HYZAAR) 100-12.5 MG tablet Take 1 tablet by mouth daily.   metFORMIN (GLUCOPHAGE) 500 MG tablet Take 500 mg by mouth daily.   Multiple Vitamins-Minerals (EYE VITAMINS PO) Take 1 tablet by mouth daily.   Omega-3 Fatty Acids (FISH OIL) 1000 MG CAPS Take by mouth.   No current  facility-administered medications for this visit. (Other)      REVIEW OF SYSTEMS:    ALLERGIES Allergies  Allergen Reactions   Amoxicillin Other (See Comments)    Upsets stomach    PAST MEDICAL HISTORY Past Medical History:  Diagnosis Date   Breast mass, right    Glaucoma    Hyperlipemia    Hypertension    Past Surgical History:  Procedure Laterality Date   BREAST EXCISIONAL BIOPSY Right    BREAST LUMPECTOMY WITH RADIOACTIVE SEED LOCALIZATION Right 06/11/2016   Procedure: BREAST LUMPECTOMY WITH RADIOACTIVE SEED LOCALIZATION;  Surgeon: Chevis Pretty III, MD;  Location: [facility];  Service: General;  Laterality: Right;   COLONOSCOPY WITH PROPOFOL N/A 03/01/2015   Procedure: COLONOSCOPY WITH PROPOFOL;  Surgeon: Charolett Bumpers, MD;  Location: [facility];  Service: Endoscopy;  Laterality: N/A;   EYE SURGERY      FAMILY HISTORY Family History  Problem Relation Age of Onset   Hypertension Mother     SOCIAL HISTORY Social History   Tobacco Use   Smoking status: Never   Smokeless tobacco: Never  Substance Use Topics   Alcohol use: No   Drug use: No         OPHTHALMIC EXAM:  Base Eye Exam     Visual Acuity (ETDRS)       Right Left   Dist [place] 20/50 +1 20/400   Dist ph [place] NI 20/200 -1  Tonometry (Tonopen, 2:46 PM)       Right Left   Pressure 14 10         Pupils       Pupils Dark Light APD   Right PERRL 4 3 None   Left PERRL 4 3 None         Visual Fields (Counting fingers)       Left Right     Full   Restrictions Total inferior temporal, inferior nasal deficiencies; Partial inner superior temporal deficiency          Extraocular Movement       Right Left    Full Full         Neuro/Psych     Oriented x3: Yes   Mood/Affect: Normal         Dilation     Both eyes: 1.0% Mydriacyl, 2.5% Phenylephrine @ 2:46 PM           Slit Lamp and Fundus Exam     External Exam       Right Left   External  Normal Normal         Slit Lamp Exam       Right Left   Lids/Lashes Normal Normal   Conjunctiva/Sclera White and quiet White and quiet   Cornea Clear Clear   Anterior Chamber Deep and quiet Deep and quiet   Iris Round and reactive Round and reactive   Lens Posterior chamber intraocular lens Posterior chamber intraocular lens   Anterior Vitreous Normal Normal         Fundus Exam       Right Left   Posterior Vitreous Normal Normal   Disc Normal 2+ Pallor, old collaterals.   C/D Ratio 0.35 0.9   Macula Atrophy, Geographic atrophy, Advanced age related macular degeneration, calcified atrophic drusenoid patchy RPE dropout in the posterior pole, paramacula, no disciform scar, no exudates, no macular thickening, no hemorrhage Atrophy, Geographic atrophy, Advanced age related macular degeneration   Vessels Normal Old central retinal vein occlusion left eye, compensated, poor perfusion peripherally   Periphery Patchy geographic atrophy with nummular choroidal RPE dropout in the retinal peripherally superior and inferiorly fairly well intact nasally. Old PRP scars nasally patchy choriocapillaris dropout peripherally 360.            IMAGING AND PROCEDURES  Imaging and Procedures for 04/11/21  OCT, Retina - OU - Both Eyes       Right Eye Quality was good. Scan locations included subfoveal. Central Foveal Thickness: 194. Progression has been stable. Findings include abnormal foveal contour, central retinal atrophy, outer retinal atrophy, preretinal fibrosis, retinal drusen , no IRF, no SRF.   Left Eye Quality was good. Scan locations included subfoveal. Central Foveal Thickness: 267. Progression has been stable. Findings include central retinal atrophy, subretinal scarring, outer retinal atrophy, abnormal foveal contour.   Notes Diffuse retinal atrophy OD largely secondary to poor perfusion status of the choroid and potentially from RNFL atrophy as well.  No signs of active CNVM.   Sclerosis of the choriocapillaris layer, with para macular loss of photoreceptor layer  OS, diffuse macular atrophy, no active maculopathy OS     Color Fundus Photography Optos - OU - Both Eyes       Right Eye Disc findings include increased cup to disc ratio. Macula : drusen, geographic atrophy. Vessels : normal observations.   Left Eye Progression has been stable. Disc findings include increased cup to disc ratio, pallor, thinning of  rim. Macula : geographic atrophy, drusen. Vessels : normal observations. Periphery : normal observations.   Notes OD with peripheral choriocapillaris RPE complex dropout superior and inferiorly.  This is not previous laser.  Dry ARMD in a paramacular area, juxta foveal center involved at this time.  OS, old CRVO now collateralized at the nerve, no active vasculopathy.  Previous PRP nasal.  Diffuse RPE dropout, dry ARMD centrally               ASSESSMENT/PLAN:  Exudative age-related macular degeneration of right eye with inactive choroidal neovascularization (HCC) No active CNVM  Advanced nonexudative age-related macular degeneration of right eye without subfoveal involvement Some encroachment into the macular region  Advanced nonexudative age-related macular degeneration of left eye with subfoveal involvement OS, geographic atrophy in the fovea no change no sign of CNVM OU     ICD-10-CM   1. Advanced nonexudative age-related macular degeneration of right eye without subfoveal involvement  H35.3113 OCT, Retina - OU - Both Eyes    Color Fundus Photography Optos - OU - Both Eyes    2. Advanced nonexudative age-related macular degeneration of left eye with subfoveal involvement  H35.3124 Color Fundus Photography Optos - OU - Both Eyes    3. Exudative age-related macular degeneration of right eye with inactive choroidal neovascularization (HCC)  H35.3212       1.  OU, progressive dry atrophic AMD.  Inner foveal change and atrophy on OCT yet  no sign of CNVM OU.  2.  3.  Ophthalmic Meds Ordered this visit:  No orders of the defined types were placed in this encounter.      Return in about 3 months (around 07/09/2021) for DILATE OU, COLOR FP, OCT.  There are no Patient Instructions on file for this visit.   Explained the diagnoses, plan, and follow up with the patient and they expressed understanding.  Patient expressed understanding of the importance of proper follow up care.   Alford Highland Hikari Tripp M.D. Diseases & Surgery of the Retina and Vitreous [facility] 04/11/21     Abbreviations: M myopia (nearsighted); A astigmatism; H hyperopia (farsighted); P presbyopia; Mrx spectacle prescription;  CTL contact lenses; OD right eye; OS left eye; OU both eyes  XT exotropia; ET esotropia; PEK punctate epithelial keratitis; PEE punctate epithelial erosions; DES dry eye syndrome; MGD meibomian gland dysfunction; ATs artificial tears; PFAT's preservative free artificial tears; NSC nuclear sclerotic cataract; PSC posterior subcapsular cataract; ERM epi-retinal membrane; PVD posterior vitreous detachment; RD retinal detachment; DM diabetes mellitus; DR diabetic retinopathy; NPDR non-proliferative diabetic retinopathy; PDR proliferative diabetic retinopathy; CSME clinically significant macular edema; DME diabetic macular edema; dbh dot blot hemorrhages; CWS cotton wool spot; POAG primary open angle glaucoma; C/D cup-to-disc ratio; HVF humphrey visual field; GVF goldmann visual field; OCT optical coherence tomography; IOP intraocular pressure; BRVO Branch retinal vein occlusion; CRVO central retinal vein occlusion; CRAO central retinal artery occlusion; BRAO branch retinal artery occlusion; RT retinal tear; SB scleral buckle; PPV pars plana vitrectomy; VH Vitreous hemorrhage; PRP panretinal laser photocoagulation; IVK intravitreal kenalog; VMT vitreomacular traction; MH Macular hole;  NVD neovascularization of the disc; NVE  neovascularization elsewhere; AREDS age related eye disease study; ARMD age related macular degeneration; POAG primary open angle glaucoma; EBMD epithelial/anterior basement membrane dystrophy; ACIOL anterior chamber intraocular lens; IOL intraocular lens; PCIOL posterior chamber intraocular lens; Phaco/IOL phacoemulsification with intraocular lens placement; PRK photorefractive keratectomy; LASIK laser assisted in situ keratomileusis; HTN hypertension; DM diabetes mellitus; COPD chronic obstructive  pulmonary disease

## 2021-04-15 DIAGNOSIS — H401133 Primary open-angle glaucoma, bilateral, severe stage: Secondary | ICD-10-CM | POA: Diagnosis not present

## 2021-04-22 ENCOUNTER — Other Ambulatory Visit: Payer: Self-pay | Admitting: Internal Medicine

## 2021-04-22 DIAGNOSIS — Z1231 Encounter for screening mammogram for malignant neoplasm of breast: Secondary | ICD-10-CM

## 2021-04-29 DIAGNOSIS — Z03818 Encounter for observation for suspected exposure to other biological agents ruled out: Secondary | ICD-10-CM | POA: Diagnosis not present

## 2021-04-29 DIAGNOSIS — M791 Myalgia, unspecified site: Secondary | ICD-10-CM | POA: Diagnosis not present

## 2021-04-29 DIAGNOSIS — R5383 Other fatigue: Secondary | ICD-10-CM | POA: Diagnosis not present

## 2021-05-01 ENCOUNTER — Other Ambulatory Visit: Payer: Self-pay

## 2021-05-01 ENCOUNTER — Ambulatory Visit
Admission: RE | Admit: 2021-05-01 | Discharge: 2021-05-01 | Disposition: A | Discharge status: Home / Self Care | Payer: Medicare Other | Source: Ambulatory Visit | Attending: Internal Medicine | Admitting: Internal Medicine

## 2021-05-01 DIAGNOSIS — Z1231 Encounter for screening mammogram for malignant neoplasm of breast: Secondary | ICD-10-CM | POA: Diagnosis not present

## 2021-05-22 DIAGNOSIS — H401123 Primary open-angle glaucoma, left eye, severe stage: Secondary | ICD-10-CM | POA: Diagnosis not present

## 2021-05-22 DIAGNOSIS — H401113 Primary open-angle glaucoma, right eye, severe stage: Secondary | ICD-10-CM | POA: Diagnosis not present

## 2021-07-02 ENCOUNTER — Encounter (INDEPENDENT_AMBULATORY_CARE_PROVIDER_SITE_OTHER): Payer: Medicare Other | Admitting: Ophthalmology

## 2021-07-09 ENCOUNTER — Ambulatory Visit (INDEPENDENT_AMBULATORY_CARE_PROVIDER_SITE_OTHER): Payer: Medicare Other | Admitting: Ophthalmology

## 2021-07-09 ENCOUNTER — Encounter (INDEPENDENT_AMBULATORY_CARE_PROVIDER_SITE_OTHER): Payer: Self-pay | Admitting: Ophthalmology

## 2021-07-09 DIAGNOSIS — H353113 Nonexudative age-related macular degeneration, right eye, advanced atrophic without subfoveal involvement: Secondary | ICD-10-CM

## 2021-07-09 DIAGNOSIS — H353124 Nonexudative age-related macular degeneration, left eye, advanced atrophic with subfoveal involvement: Secondary | ICD-10-CM

## 2021-07-09 DIAGNOSIS — H353212 Exudative age-related macular degeneration, right eye, with inactive choroidal neovascularization: Secondary | ICD-10-CM | POA: Diagnosis not present

## 2021-07-09 DIAGNOSIS — H348122 Central retinal vein occlusion, left eye, stable: Secondary | ICD-10-CM | POA: Diagnosis not present

## 2021-07-09 NOTE — Assessment & Plan Note (Signed)
No sign reactivation of disease ?

## 2021-07-09 NOTE — Assessment & Plan Note (Signed)
No sign of CME OR NV from prior old CRVO ?

## 2021-07-09 NOTE — Assessment & Plan Note (Signed)
Subfoveal atrophy likely candidate for Syfovre ?

## 2021-07-09 NOTE — Progress Notes (Signed)
? ? ?07/09/2021 ? ?  ? ?CHIEF COMPLAINT ?Patient presents for  ?Chief Complaint  ?Patient presents with  ? Macular Degeneration  ? ? ? ? ?HISTORY OF PRESENT ILLNESS: ?Tami Mercado is a 81 y.o. female who presents to the clinic today for:  ? ?HPI   ?3 mos fu Dilate OU, COLOR FP, oct. ?Patient states vision is stable and unchanged since last visit. Denies any new floaters or FOL. ?Patient uses Brimonidine BID OU, Dorz-tim BID OU. ?Patient also takes Eye Vitamins QD. ?Last edited by Nelva Nay on 07/09/2021  2:07 PM.  ?  ? ? ?Referring physician: ?Georgann Housekeeper, MD ?[address] ?[address] ?[place],  [place] [address] ? ?HISTORICAL INFORMATION:  ? ?Selected notes from the MEDICAL RECORD NUMBER ?  ?   ? ?CURRENT MEDICATIONS: ?Current Outpatient Medications (Ophthalmic Drugs)  ?Medication Sig  ? brimonidine (ALPHAGAN P) 0.1 % SOLN   ? dorzolamide (TRUSOPT) 2 % ophthalmic solution 1 drop 3 (three) times daily.  ? dorzolamide-timolol (COSOPT) 22.3-6.8 MG/ML ophthalmic solution Place 1 drop into both eyes every 12 hours.  ? Netarsudil-Latanoprost (ROCKLATAN) 0.02-0.005 % SOLN Place 1 drop into the right eye daily.  ? ?No current facility-administered medications for this visit. (Ophthalmic Drugs)  ? ?Current Outpatient Medications (Other)  ?Medication Sig  ? amLODipine (NORVASC) 10 MG tablet Take 10 mg by mouth daily.  ? fenofibrate (TRICOR) 145 MG tablet Take 145 mg by mouth daily.  ? hydrochlorothiazide (HYDRODIURIL) 12.5 MG tablet Take 12.5 mg by mouth daily.  ? losartan-hydrochlorothiazide (HYZAAR) 100-12.5 MG tablet Take 1 tablet by mouth daily.  ? metFORMIN (GLUCOPHAGE) 500 MG tablet Take 500 mg by mouth daily.  ? Multiple Vitamins-Minerals (EYE VITAMINS PO) Take 1 tablet by mouth daily.  ? Omega-3 Fatty Acids (FISH OIL) 1000 MG CAPS Take by mouth.  ? ?No current facility-administered medications for this visit. (Other)  ? ? ? ? ?REVIEW OF SYSTEMS: ?ROS   ?Negative for: Constitutional, Gastrointestinal,  Neurological, Skin, Genitourinary, Musculoskeletal, HENT, Endocrine, Cardiovascular, Eyes, Respiratory, Psychiatric, Allergic/Imm, Heme/Lymph ?Last edited by Edmon Crape, MD on 07/09/2021  3:07 PM.  ?  ? ? ? ?ALLERGIES ?Allergies  ?Allergen Reactions  ? Amoxicillin Other (See Comments)  ?  Upsets stomach  ? ? ?PAST MEDICAL HISTORY ?Past Medical History:  ?Diagnosis Date  ? Breast mass, right   ? Glaucoma   ? Hyperlipemia   ? Hypertension   ? ?Past Surgical History:  ?Procedure Laterality Date  ? BREAST EXCISIONAL BIOPSY Right   ? BREAST LUMPECTOMY WITH RADIOACTIVE SEED LOCALIZATION Right 06/11/2016  ? Procedure: BREAST LUMPECTOMY WITH RADIOACTIVE SEED LOCALIZATION;  Surgeon: Chevis Pretty III, MD;  Location: [facility];  Service: General;  Laterality: Right;  ? COLONOSCOPY WITH PROPOFOL N/A 03/01/2015  ? Procedure: COLONOSCOPY WITH PROPOFOL;  Surgeon: Charolett Bumpers, MD;  Location: [facility];  Service: Endoscopy;  Laterality: N/A;  ? EYE SURGERY    ? ? ?FAMILY HISTORY ?Family History  ?Problem Relation Age of Onset  ? Hypertension Mother   ? ? ?SOCIAL HISTORY ?Social History  ? ?Tobacco Use  ? Smoking status: Never  ? Smokeless tobacco: Never  ?Substance Use Topics  ? Alcohol use: No  ? Drug use: No  ? ?  ? ?  ? ?OPHTHALMIC EXAM: ? ?Base Eye Exam   ? ? Visual Acuity (ETDRS)   ? ?   Right Left  ? Dist [place] 20/50 -2 20/200 +1  ? Dist ph [place]  NI NI  ? ?  ?  ? ? Tonometry (Tonopen, 2:05 PM)   ? ?   Right Left  ? Pressure 10 16  ? ?  ?  ? ? Pupils   ? ?   Pupils Dark Light APD  ? Right PERRL 4 3 None  ? Left PERRL 4 3 None  ? ?  ?  ? ? Visual Fields   ? ?   Left Right  ?   Full  ? Restrictions Partial inner superior temporal, inferior temporal, superior nasal, inferior nasal deficiencies   ? ?  ?  ? ? Extraocular Movement   ? ?   Right Left  ?  Full Full  ? ?  ?  ? ? Neuro/Psych   ? ? Oriented x3: Yes  ? Mood/Affect: Normal  ? ?  ?  ? ? Dilation   ? ? Both eyes: 1.0% Mydriacyl, 2.5% Phenylephrine @ 2:05 PM   ? ?  ?  ? ?  ? ?Slit Lamp and Fundus Exam   ? ? External Exam   ? ?   Right Left  ? External Normal Normal  ? ?  ?  ? ? Slit Lamp Exam   ? ?   Right Left  ? Lids/Lashes Normal Normal  ? Conjunctiva/Sclera White and quiet White and quiet  ? Cornea Clear Clear  ? Anterior Chamber Deep and quiet Deep and quiet  ? Iris Round and reactive Round and reactive  ? Lens Posterior chamber intraocular lens Posterior chamber intraocular lens  ? Anterior Vitreous Normal Normal  ? ?  ?  ? ? Fundus Exam   ? ?   Right Left  ? Posterior Vitreous Normal Normal  ? Disc Normal 2+ Pallor, old collaterals.  ? C/D Ratio 0.35 0.9  ? Macula Atrophy, Geographic atrophy, Advanced age related macular degeneration, calcified atrophic drusenoid patchy RPE dropout in the posterior pole, paramacula, no disciform scar, no exudates, no macular thickening, no hemorrhage Atrophy, Geographic atrophy, Advanced age related macular degeneration  ? Vessels Normal Old central retinal vein occlusion left eye, compensated, poor perfusion peripherally  ? Periphery Patchy geographic atrophy with nummular choroidal RPE dropout in the retinal peripherally superior and inferiorly fairly well intact nasally. Old PRP scars nasally patchy choriocapillaris dropout peripherally 360.  ? ?  ?  ? ?  ? ? ?IMAGING AND PROCEDURES  ?Imaging and Procedures for 07/09/21 ? ?OCT, Retina - OU - Both Eyes   ? ?   ?Right Eye ?Quality was good. Scan locations included subfoveal. Central Foveal Thickness: 200. Progression has been stable. Findings include abnormal foveal contour, central retinal atrophy, outer retinal atrophy, preretinal fibrosis, retinal drusen , no IRF, no SRF.  ? ?Left Eye ?Quality was good. Scan locations included subfoveal. Central Foveal Thickness: 258. Progression has been stable. Findings include central retinal atrophy, subretinal scarring, outer retinal atrophy, abnormal foveal contour.  ? ?Notes ?Diffuse retinal atrophy OD largely secondary to poor  perfusion status of the choroid and potentially from RNFL atrophy as well.  No signs of active CNVM.  Sclerosis of the choriocapillaris layer, with para macular loss of photoreceptor layer ? ?OS, diffuse macular atrophy, no active maculopathy OS ? ?  ? ?Color Fundus Photography Optos - OU - Both Eyes   ? ?   ?Right Eye ?Disc findings include increased cup to disc ratio. Macula : drusen, geographic atrophy. Vessels : normal observations.  ? ?Left Eye ?Progression has been stable. Disc findings include  increased cup to disc ratio, pallor, thinning of rim. Macula : geographic atrophy, drusen. Vessels : normal observations. Periphery : normal observations.  ? ?Notes ?OD with peripheral choriocapillaris RPE complex dropout superior and inferiorly.  This is not previous laser.  Dry ARMD in a paramacular area, juxta foveal center involved at this time. ? ?OS, old CRVO now collateralized at the nerve, no active vasculopathy.  Previous PRP nasal.  Diffuse RPE dropout, dry ARMD centrally ? ? ? ?  ? ? ?  ?  ? ?  ?ASSESSMENT/PLAN: ? ?Stable hemispheric central retinal vein occlusion (CRVO) of left eye ?No sign of CME OR NV from prior old CRVO ? ?Exudative age-related macular degeneration of right eye with inactive choroidal neovascularization (HCC) ?No sign reactivation of disease ? ?Advanced nonexudative age-related macular degeneration of right eye without subfoveal involvement ?Perifoveal atrophy, excellent candidate for Syfovre ? ?Advanced nonexudative age-related macular degeneration of left eye with subfoveal involvement ?Subfoveal atrophy likely candidate for Syfovre  ? ?  ICD-10-CM   ?1. Advanced nonexudative age-related macular degeneration of right eye without subfoveal involvement  H35.3113 OCT, Retina - OU - Both Eyes  ?  Color Fundus Photography Optos - OU - Both Eyes  ?  ?2. Advanced nonexudative age-related macular degeneration of left eye with subfoveal involvement  H35.3124 OCT, Retina - OU - Both Eyes  ?   Color Fundus Photography Optos - OU - Both Eyes  ?  ?3. Stable hemispheric central retinal vein occlusion (CRVO) of left eye  H34.8122   ?  ?4. Exudative age-related macular degeneration of right eye with inactive choroidal

## 2021-07-09 NOTE — Assessment & Plan Note (Signed)
Perifoveal atrophy, excellent candidate for Syfovre ?

## 2021-07-12 DIAGNOSIS — R7303 Prediabetes: Secondary | ICD-10-CM | POA: Diagnosis not present

## 2021-07-12 DIAGNOSIS — Z853 Personal history of malignant neoplasm of breast: Secondary | ICD-10-CM | POA: Diagnosis not present

## 2021-07-12 DIAGNOSIS — K219 Gastro-esophageal reflux disease without esophagitis: Secondary | ICD-10-CM | POA: Diagnosis not present

## 2021-07-12 DIAGNOSIS — E782 Mixed hyperlipidemia: Secondary | ICD-10-CM | POA: Diagnosis not present

## 2021-07-12 DIAGNOSIS — I10 Essential (primary) hypertension: Secondary | ICD-10-CM | POA: Diagnosis not present

## 2021-07-20 DIAGNOSIS — R059 Cough, unspecified: Secondary | ICD-10-CM | POA: Diagnosis not present

## 2021-07-20 DIAGNOSIS — R6883 Chills (without fever): Secondary | ICD-10-CM | POA: Diagnosis not present

## 2021-07-20 DIAGNOSIS — U071 COVID-19: Secondary | ICD-10-CM | POA: Diagnosis not present

## 2021-07-20 DIAGNOSIS — R52 Pain, unspecified: Secondary | ICD-10-CM | POA: Diagnosis not present

## 2021-07-20 DIAGNOSIS — R0981 Nasal congestion: Secondary | ICD-10-CM | POA: Diagnosis not present

## 2021-08-22 DIAGNOSIS — H401113 Primary open-angle glaucoma, right eye, severe stage: Secondary | ICD-10-CM | POA: Diagnosis not present

## 2021-08-22 DIAGNOSIS — H401123 Primary open-angle glaucoma, left eye, severe stage: Secondary | ICD-10-CM | POA: Diagnosis not present

## 2021-09-05 ENCOUNTER — Encounter (INDEPENDENT_AMBULATORY_CARE_PROVIDER_SITE_OTHER): Payer: Medicare Other | Admitting: Ophthalmology

## 2021-09-17 DIAGNOSIS — I10 Essential (primary) hypertension: Secondary | ICD-10-CM | POA: Diagnosis not present

## 2021-09-17 DIAGNOSIS — K219 Gastro-esophageal reflux disease without esophagitis: Secondary | ICD-10-CM | POA: Diagnosis not present

## 2021-09-17 DIAGNOSIS — H401133 Primary open-angle glaucoma, bilateral, severe stage: Secondary | ICD-10-CM | POA: Diagnosis not present

## 2021-09-17 DIAGNOSIS — Z48810 Encounter for surgical aftercare following surgery on the sense organs: Secondary | ICD-10-CM | POA: Diagnosis not present

## 2021-09-17 DIAGNOSIS — H401113 Primary open-angle glaucoma, right eye, severe stage: Secondary | ICD-10-CM | POA: Diagnosis not present

## 2021-09-18 DIAGNOSIS — I10 Essential (primary) hypertension: Secondary | ICD-10-CM | POA: Diagnosis not present

## 2021-09-18 DIAGNOSIS — Z48810 Encounter for surgical aftercare following surgery on the sense organs: Secondary | ICD-10-CM | POA: Diagnosis not present

## 2021-09-18 DIAGNOSIS — H401133 Primary open-angle glaucoma, bilateral, severe stage: Secondary | ICD-10-CM | POA: Diagnosis not present

## 2021-09-18 DIAGNOSIS — H401113 Primary open-angle glaucoma, right eye, severe stage: Secondary | ICD-10-CM | POA: Diagnosis not present

## 2021-09-18 DIAGNOSIS — K219 Gastro-esophageal reflux disease without esophagitis: Secondary | ICD-10-CM | POA: Diagnosis not present

## 2021-10-10 DIAGNOSIS — Z48810 Encounter for surgical aftercare following surgery on the sense organs: Secondary | ICD-10-CM | POA: Diagnosis not present

## 2021-10-10 DIAGNOSIS — H401113 Primary open-angle glaucoma, right eye, severe stage: Secondary | ICD-10-CM | POA: Diagnosis not present

## 2021-10-10 DIAGNOSIS — Z79899 Other long term (current) drug therapy: Secondary | ICD-10-CM | POA: Diagnosis not present

## 2021-11-07 DIAGNOSIS — I10 Essential (primary) hypertension: Secondary | ICD-10-CM | POA: Diagnosis not present

## 2021-11-07 DIAGNOSIS — Z853 Personal history of malignant neoplasm of breast: Secondary | ICD-10-CM | POA: Diagnosis not present

## 2021-11-07 DIAGNOSIS — E1122 Type 2 diabetes mellitus with diabetic chronic kidney disease: Secondary | ICD-10-CM | POA: Diagnosis not present

## 2021-11-07 DIAGNOSIS — M810 Age-related osteoporosis without current pathological fracture: Secondary | ICD-10-CM | POA: Diagnosis not present

## 2021-11-07 DIAGNOSIS — N182 Chronic kidney disease, stage 2 (mild): Secondary | ICD-10-CM | POA: Diagnosis not present

## 2021-11-07 DIAGNOSIS — H409 Unspecified glaucoma: Secondary | ICD-10-CM | POA: Diagnosis not present

## 2021-11-07 DIAGNOSIS — J040 Acute laryngitis: Secondary | ICD-10-CM | POA: Diagnosis not present

## 2021-12-09 DIAGNOSIS — H401133 Primary open-angle glaucoma, bilateral, severe stage: Secondary | ICD-10-CM | POA: Diagnosis not present

## 2021-12-10 DIAGNOSIS — H52223 Regular astigmatism, bilateral: Secondary | ICD-10-CM | POA: Diagnosis not present

## 2021-12-10 DIAGNOSIS — H353132 Nonexudative age-related macular degeneration, bilateral, intermediate dry stage: Secondary | ICD-10-CM | POA: Diagnosis not present

## 2021-12-10 DIAGNOSIS — H5213 Myopia, bilateral: Secondary | ICD-10-CM | POA: Diagnosis not present

## 2022-01-13 DIAGNOSIS — H401123 Primary open-angle glaucoma, left eye, severe stage: Secondary | ICD-10-CM | POA: Diagnosis not present

## 2022-01-13 DIAGNOSIS — H401113 Primary open-angle glaucoma, right eye, severe stage: Secondary | ICD-10-CM | POA: Diagnosis not present

## 2022-02-14 DIAGNOSIS — M62838 Other muscle spasm: Secondary | ICD-10-CM | POA: Diagnosis not present

## 2022-02-14 DIAGNOSIS — M545 Low back pain, unspecified: Secondary | ICD-10-CM | POA: Diagnosis not present

## 2022-02-18 ENCOUNTER — Other Ambulatory Visit: Payer: Self-pay | Admitting: Internal Medicine

## 2022-02-18 DIAGNOSIS — H353113 Nonexudative age-related macular degeneration, right eye, advanced atrophic without subfoveal involvement: Secondary | ICD-10-CM | POA: Diagnosis not present

## 2022-02-18 DIAGNOSIS — H348121 Central retinal vein occlusion, left eye, with retinal neovascularization: Secondary | ICD-10-CM | POA: Diagnosis not present

## 2022-02-18 DIAGNOSIS — H353212 Exudative age-related macular degeneration, right eye, with inactive choroidal neovascularization: Secondary | ICD-10-CM | POA: Diagnosis not present

## 2022-02-18 DIAGNOSIS — E1122 Type 2 diabetes mellitus with diabetic chronic kidney disease: Secondary | ICD-10-CM | POA: Diagnosis not present

## 2022-02-18 DIAGNOSIS — H348122 Central retinal vein occlusion, left eye, stable: Secondary | ICD-10-CM | POA: Diagnosis not present

## 2022-02-18 DIAGNOSIS — I10 Essential (primary) hypertension: Secondary | ICD-10-CM | POA: Diagnosis not present

## 2022-02-18 DIAGNOSIS — M810 Age-related osteoporosis without current pathological fracture: Secondary | ICD-10-CM

## 2022-02-18 DIAGNOSIS — M1990 Unspecified osteoarthritis, unspecified site: Secondary | ICD-10-CM | POA: Diagnosis not present

## 2022-02-18 DIAGNOSIS — N182 Chronic kidney disease, stage 2 (mild): Secondary | ICD-10-CM | POA: Diagnosis not present

## 2022-02-18 DIAGNOSIS — Z0000 Encounter for general adult medical examination without abnormal findings: Secondary | ICD-10-CM | POA: Diagnosis not present

## 2022-02-18 DIAGNOSIS — H353124 Nonexudative age-related macular degeneration, left eye, advanced atrophic with subfoveal involvement: Secondary | ICD-10-CM | POA: Diagnosis not present

## 2022-02-18 DIAGNOSIS — E782 Mixed hyperlipidemia: Secondary | ICD-10-CM | POA: Diagnosis not present

## 2022-02-18 DIAGNOSIS — K219 Gastro-esophageal reflux disease without esophagitis: Secondary | ICD-10-CM | POA: Diagnosis not present

## 2022-02-18 DIAGNOSIS — H409 Unspecified glaucoma: Secondary | ICD-10-CM | POA: Diagnosis not present

## 2022-02-18 DIAGNOSIS — H40012 Open angle with borderline findings, low risk, left eye: Secondary | ICD-10-CM | POA: Diagnosis not present

## 2022-02-18 DIAGNOSIS — Z853 Personal history of malignant neoplasm of breast: Secondary | ICD-10-CM | POA: Diagnosis not present

## 2022-04-24 DIAGNOSIS — H401123 Primary open-angle glaucoma, left eye, severe stage: Secondary | ICD-10-CM | POA: Diagnosis not present

## 2022-04-24 DIAGNOSIS — H401113 Primary open-angle glaucoma, right eye, severe stage: Secondary | ICD-10-CM | POA: Diagnosis not present

## 2022-04-29 DIAGNOSIS — R0789 Other chest pain: Secondary | ICD-10-CM | POA: Diagnosis not present

## 2022-04-29 DIAGNOSIS — K146 Glossodynia: Secondary | ICD-10-CM | POA: Diagnosis not present

## 2022-05-09 ENCOUNTER — Other Ambulatory Visit: Payer: Self-pay | Admitting: Internal Medicine

## 2022-05-09 DIAGNOSIS — Z1231 Encounter for screening mammogram for malignant neoplasm of breast: Secondary | ICD-10-CM

## 2022-05-19 DIAGNOSIS — M810 Age-related osteoporosis without current pathological fracture: Secondary | ICD-10-CM | POA: Diagnosis not present

## 2022-05-19 DIAGNOSIS — K219 Gastro-esophageal reflux disease without esophagitis: Secondary | ICD-10-CM | POA: Diagnosis not present

## 2022-05-19 DIAGNOSIS — E782 Mixed hyperlipidemia: Secondary | ICD-10-CM | POA: Diagnosis not present

## 2022-05-19 DIAGNOSIS — E46 Unspecified protein-calorie malnutrition: Secondary | ICD-10-CM | POA: Diagnosis not present

## 2022-05-19 DIAGNOSIS — E1122 Type 2 diabetes mellitus with diabetic chronic kidney disease: Secondary | ICD-10-CM | POA: Diagnosis not present

## 2022-05-19 DIAGNOSIS — R079 Chest pain, unspecified: Secondary | ICD-10-CM | POA: Diagnosis not present

## 2022-05-19 DIAGNOSIS — I10 Essential (primary) hypertension: Secondary | ICD-10-CM | POA: Diagnosis not present

## 2022-05-19 DIAGNOSIS — N182 Chronic kidney disease, stage 2 (mild): Secondary | ICD-10-CM | POA: Diagnosis not present

## 2022-05-22 ENCOUNTER — Ambulatory Visit: Payer: Medicare Other | Admitting: Cardiology

## 2022-05-23 ENCOUNTER — Ambulatory Visit: Payer: Medicare Other | Admitting: Internal Medicine

## 2022-05-23 ENCOUNTER — Encounter: Payer: Self-pay | Admitting: Internal Medicine

## 2022-05-23 VITALS — BP 152/69 | HR 64 | Ht 62.0 in | Wt 108.0 lb

## 2022-05-23 DIAGNOSIS — E782 Mixed hyperlipidemia: Secondary | ICD-10-CM

## 2022-05-23 DIAGNOSIS — E119 Type 2 diabetes mellitus without complications: Secondary | ICD-10-CM

## 2022-05-23 DIAGNOSIS — R079 Chest pain, unspecified: Secondary | ICD-10-CM | POA: Diagnosis not present

## 2022-05-23 DIAGNOSIS — I10 Essential (primary) hypertension: Secondary | ICD-10-CM | POA: Diagnosis not present

## 2022-05-23 DIAGNOSIS — R9431 Abnormal electrocardiogram [ECG] [EKG]: Secondary | ICD-10-CM | POA: Diagnosis not present

## 2022-05-23 MED ORDER — NITROGLYCERIN 0.4 MG SL SUBL: 0.4000 mg | SUBLINGUAL_TABLET | SUBLINGUAL | 3 refills | Status: AC | PRN

## 2022-05-23 NOTE — Progress Notes (Signed)
Primary Physician/Referring:  Wenda Low, MD  Patient ID: Mal Amabile, female    DOB: [birth year], 82 y.o.   MRN: [id number]  Chief Complaint  Patient presents with   Chest Pain   New Patient (Initial Visit)   HPI:    Tami Mercado  is a 82 y.o. female with past medical history significant for hypertension, hyperlipidemia, and diabetes who is here to establish care with cardiology due to chest pain on exertion.  Patient states she works out regularly and has been working out for many years.  She has noticed recently that she develops chest pain with heavier activity and this is new for her.  Patient does not have any cardiac history.  She has not had a stress test or an echocardiogram in the past.  She notices that the chest pain is worse when she is walking uphill.  A few months ago patient did not have any chest pain with exertion but she notices it now when she is on the treadmill as well.  Patient denies shortness of breath, palpitations, diaphoresis, syncope, edema, orthopnea, PND, claudication.  Past Medical History:  Diagnosis Date   Breast mass, right    Glaucoma    Hyperlipemia    Hypertension    Past Surgical History:  Procedure Laterality Date   BREAST EXCISIONAL BIOPSY Right    BREAST LUMPECTOMY WITH RADIOACTIVE SEED LOCALIZATION Right 06/11/2016   Procedure: BREAST LUMPECTOMY WITH RADIOACTIVE SEED LOCALIZATION;  Surgeon: Autumn Messing III, MD;  Location: [facility];  Service: General;  Laterality: Right;   COLONOSCOPY WITH PROPOFOL N/A 03/01/2015   Procedure: COLONOSCOPY WITH PROPOFOL;  Surgeon: Garlan Fair, MD;  Location: [facility];  Service: Endoscopy;  Laterality: N/A;   EYE SURGERY     Family History  Problem Relation Age of Onset   Hypertension Mother    Diabetes Mother     Social History   Tobacco Use   Smoking status: Never   Smokeless tobacco: Never  Substance Use Topics   Alcohol use: No   Marital Status: Married  ROS   Review of Systems  Cardiovascular:  Positive for chest pain.   Objective  Blood pressure (!) 152/69, pulse 64, height 5\' 2"  (1.575 m), weight 108 lb (49 kg), SpO2 96 %. Body mass index is 19.75 kg/m.     05/23/2022   10:24 AM 07/17/2016   10:48 AM 06/11/2016    1:49 PM  Vitals with BMI  Height 5\' 2"  5\' 2"    Weight 108 lbs 109 lbs 14 oz   BMI [id number]   Systolic 0000000 [id number]  Diastolic 69 75 60  Pulse 64 66 65     Physical Exam Vitals reviewed.  HENT:     Head: Normocephalic and atraumatic.  Neck:     Vascular: No carotid bruit.  Cardiovascular:     Rate and Rhythm: Normal rate and regular rhythm.     Pulses: Normal pulses.     Heart sounds: Normal heart sounds. No murmur heard. Pulmonary:     Effort: Pulmonary effort is normal.     Breath sounds: Normal breath sounds.  Abdominal:     General: Bowel sounds are normal.  Musculoskeletal:     Right lower leg: No edema.     Left lower leg: No edema.  Skin:    General: Skin is warm and dry.  Neurological:     Mental Status: She is alert.     Medications and allergies  Allergies  Allergen Reactions   Alendronate Other (See Comments)   Amoxicillin Other (See Comments)    Upsets stomach   Dicyclomine Hcl     Other Reaction(s): dizzy/sleepy   Lisinopril Cough     Medication list after today's encounter   Current Outpatient Medications:    brimonidine (ALPHAGAN P) 0.1 % SOLN, 1 drop into Left eye Ophthalmic Three times a day, Disp: , Rfl:    Cholecalciferol (VITAMIN D) 50 MCG (2000 UT) CAPS, Take by mouth., Disp: , Rfl:    diclofenac Sodium (VOLTAREN) 1 % GEL, Apply topically 4 (four) times daily., Disp: , Rfl:    dorzolamide-timolol (COSOPT) 22.3-6.8 MG/ML ophthalmic solution, Place 1 drop into both eyes every 12 hours., Disp: , Rfl:    escitalopram (LEXAPRO) 5 MG tablet, Take 5 mg by mouth daily., Disp: , Rfl:    fenofibrate (TRICOR) 145 MG tablet, Take 145 mg by mouth daily., Disp: , Rfl:    metFORMIN  (GLUCOPHAGE) 500 MG tablet, Take 500 mg by mouth daily., Disp: , Rfl:    metoprolol succinate (TOPROL-XL) 25 MG 24 hr tablet, Take 1 tablet by mouth at bedtime., Disp: , Rfl:    Multiple Vitamins-Minerals (EYE VITAMINS PO), Take 1 tablet by mouth daily., Disp: , Rfl:    nitroGLYCERIN (NITROSTAT) 0.4 MG SL tablet, Place 1 tablet (0.4 mg total) under the tongue every 5 (five) minutes as needed for up to 25 days for chest pain., Disp: 25 tablet, Rfl: 3   pantoprazole (PROTONIX) 40 MG tablet, Take 40 mg by mouth daily., Disp: , Rfl:    spironolactone (ALDACTONE) 25 MG tablet, Take 25 mg by mouth daily., Disp: , Rfl:    tizanidine (ZANAFLEX) 2 MG capsule, Take 2 mg by mouth 3 (three) times daily., Disp: , Rfl:    valsartan-hydrochlorothiazide (DIOVAN-HCT) 320-12.5 MG tablet, Take 1 tablet by mouth daily., Disp: , Rfl:    amLODipine (NORVASC) 10 MG tablet, Take 10 mg by mouth daily., Disp: , Rfl:    dorzolamide (TRUSOPT) 2 % ophthalmic solution, 1 drop 3 (three) times daily., Disp: , Rfl:   Laboratory examination:   Lab Results  Component Value Date   NA 142 06/05/2016   K 4.9 06/05/2016   CO2 31 06/05/2016   GLUCOSE 162 (H) 06/05/2016   BUN 21 (H) 06/05/2016   CREATININE 0.70 06/05/2016   CALCIUM 9.3 06/05/2016   GFRNONAA >60 06/05/2016       Latest Ref Rng & Units 06/05/2016   10:30 AM  CMP  Glucose 65 - 99 mg/dL 162   BUN 6 - 20 mg/dL 21   Creatinine 0.44 - 1.00 mg/dL 0.70   Sodium 135 - 145 mmol/L 142   Potassium 3.5 - 5.1 mmol/L 4.9   Chloride 101 - 111 mmol/L 106   CO2 22 - 32 mmol/L 31   Calcium 8.9 - 10.3 mg/dL 9.3        No data to display          Lipid Panel No results for input(s): "CHOL", "TRIG", "[place]", "VLDL", "HDL", "CHOLHDL", "LDLDIRECT" in the last 8760 hours.  HEMOGLOBIN A1C No results found for: "HGBA1C", "MPG" TSH No results for input(s): "TSH" in the last 8760 hours.  External labs:   Labs 02/18/2022: Cholesterol 196 HDL 49 triglycerides 152  LDL 120 vitamin D level 53.2 TSH 2.47 Glucose 122 BUN 25 creatinine 0.83 potassium 4.6 hemoglobin 12.1 hematocrit 36.3 platelets 278    Radiology:    Cardiac Studies:  EKG:   05/23/2022: Sinus Bradycardia, rate 59 bpm. Negative T-waves in precordial leads.  Assessment     ICD-10-CM   1. Chest pain of uncertain etiology  [id number] EKG 12-Lead    PCV ECHOCARDIOGRAM COMPLETE    PCV MYOCARDIAL PERFUSION WO LEXISCAN    2. Essential hypertension  I10 PCV ECHOCARDIOGRAM COMPLETE    PCV MYOCARDIAL PERFUSION WO LEXISCAN    3. Mixed hyperlipidemia  E78.2 PCV ECHOCARDIOGRAM COMPLETE    PCV MYOCARDIAL PERFUSION WO LEXISCAN    4. Type 2 diabetes mellitus without complication, without long-term current use of insulin (HCC)  E11.9 PCV ECHOCARDIOGRAM COMPLETE    PCV MYOCARDIAL PERFUSION WO LEXISCAN    5. Chest pain on exertion  R07.9 PCV ECHOCARDIOGRAM COMPLETE    PCV MYOCARDIAL PERFUSION WO LEXISCAN    6. Nonspecific abnormal electrocardiogram (ECG) (EKG)  R94.31 PCV ECHOCARDIOGRAM COMPLETE    PCV MYOCARDIAL PERFUSION WO LEXISCAN       Orders Placed This Encounter  Procedures   PCV MYOCARDIAL PERFUSION WO LEXISCAN    Standing Status:   Future    Standing Expiration Date:   07/23/2022   EKG 12-Lead   PCV ECHOCARDIOGRAM COMPLETE    Standing Status:   Future    Standing Expiration Date:   05/23/2023    Meds ordered this encounter  Medications   nitroGLYCERIN (NITROSTAT) 0.4 MG SL tablet    Sig: Place 1 tablet (0.4 mg total) under the tongue every 5 (five) minutes as needed for up to 25 days for chest pain.    Dispense:  25 tablet    Refill:  3    Medications Discontinued During This Encounter  Medication Reason   brimonidine (ALPHAGAN P) 0.1 % SOLN Duplicate   hydrochlorothiazide (HYDRODIURIL) 12.5 MG tablet Completed Course   losartan-hydrochlorothiazide (HYZAAR) 100-12.5 MG tablet Completed Course   fluticasone (FLONASE) 50 MCG/ACT nasal spray Completed Course    prednisoLONE acetate (PRED FORTE) 1 % ophthalmic suspension Completed Course   Omega-3 Fatty Acids (FISH OIL) 1000 MG CAPS Completed Course   Netarsudil-Latanoprost (ROCKLATAN) 0.02-0.005 % SOLN Completed Course     Recommendations:   ZAKYIA BAYAT is a 82 y.o.  female with chest pain on exertion  Chest pain on exertion Nonspecific abnormal electrocardiogram (ECG) (EKG) Echocardiogram and stress test ordered Patient instructed not to do heavy lifting, heavy exertional activity, swimming until evaluation is complete.  Patient instructed to call if symptoms worse or to go to the ED for further evaluation. SL nitro sent   Essential hypertension Continue current cardiac medications. Encourage low-sodium diet, less than 2000 mg daily. BP slightly elevated today due to feeling nervous. Follow-up in 1-2 months or sooner if needed.   Mixed hyperlipidemia On fenofibrate   Type 2 diabetes mellitus without complication, without long-term current use of insulin ([place]) On metformin     Floydene Flock, DO, [facility]  05/23/2022, 11:51 AM Office: [phone] Pager: [phone]

## 2022-06-02 DIAGNOSIS — E1122 Type 2 diabetes mellitus with diabetic chronic kidney disease: Secondary | ICD-10-CM | POA: Diagnosis not present

## 2022-06-03 ENCOUNTER — Ambulatory Visit: Payer: Medicare Other

## 2022-06-03 DIAGNOSIS — R079 Chest pain, unspecified: Secondary | ICD-10-CM

## 2022-06-03 DIAGNOSIS — E119 Type 2 diabetes mellitus without complications: Secondary | ICD-10-CM

## 2022-06-03 DIAGNOSIS — E782 Mixed hyperlipidemia: Secondary | ICD-10-CM

## 2022-06-03 DIAGNOSIS — R9431 Abnormal electrocardiogram [ECG] [EKG]: Secondary | ICD-10-CM | POA: Diagnosis not present

## 2022-06-03 DIAGNOSIS — I10 Essential (primary) hypertension: Secondary | ICD-10-CM

## 2022-06-05 ENCOUNTER — Ambulatory Visit: Payer: Medicare Other

## 2022-06-05 DIAGNOSIS — E119 Type 2 diabetes mellitus without complications: Secondary | ICD-10-CM | POA: Diagnosis not present

## 2022-06-05 DIAGNOSIS — R079 Chest pain, unspecified: Secondary | ICD-10-CM | POA: Diagnosis not present

## 2022-06-05 DIAGNOSIS — R9431 Abnormal electrocardiogram [ECG] [EKG]: Secondary | ICD-10-CM | POA: Diagnosis not present

## 2022-06-05 DIAGNOSIS — E782 Mixed hyperlipidemia: Secondary | ICD-10-CM

## 2022-06-05 DIAGNOSIS — I10 Essential (primary) hypertension: Secondary | ICD-10-CM

## 2022-06-09 NOTE — Progress Notes (Signed)
Pt needs sooner appointment per [facility]

## 2022-06-09 NOTE — Progress Notes (Signed)
Can this pt come in sooner

## 2022-06-10 ENCOUNTER — Ambulatory Visit: Payer: Medicare Other | Admitting: Internal Medicine

## 2022-06-10 ENCOUNTER — Encounter: Payer: Self-pay | Admitting: Internal Medicine

## 2022-06-10 VITALS — BP 153/59 | HR 57 | Ht 62.0 in | Wt 109.4 lb

## 2022-06-10 DIAGNOSIS — E782 Mixed hyperlipidemia: Secondary | ICD-10-CM

## 2022-06-10 DIAGNOSIS — I10 Essential (primary) hypertension: Secondary | ICD-10-CM | POA: Diagnosis not present

## 2022-06-10 DIAGNOSIS — R079 Chest pain, unspecified: Secondary | ICD-10-CM

## 2022-06-10 MED ORDER — ASPIRIN 81 MG PO TBEC: 81.0000 mg | DELAYED_RELEASE_TABLET | Freq: Every day | ORAL | 12 refills | Status: DC

## 2022-06-10 MED ORDER — ROSUVASTATIN CALCIUM 20 MG PO TABS: 20.0000 mg | ORAL_TABLET | Freq: Every day | ORAL | 3 refills | Status: DC

## 2022-06-10 NOTE — Progress Notes (Signed)
 Primary Physician/Referring:  Husain, Karrar, MD  Patient ID: Tami Mercado, female    DOB: 10/02/1940, 81 y.o.   MRN: [id number]  Chief Complaint  Patient presents with   Chest Pain   Follow-up   Results   HPI:    Tami Mercado  is a 81 y.o. female with past medical history significant for hypertension, hyperlipidemia, and diabetes who is here for a follow-up visit due to chest pain on exertion.  Patient states she works out regularly and has been working out for many years.  She has noticed recently that she develops chest pain with heavier activity and this is new for her. She notices that the chest pain is worse when she is walking uphill.  A few months ago patient did not have any chest pain with exertion but she notices it now when she is on the treadmill as well. Patient is agreeable to proceed with cardiac catheterization given positive stress test and chest pain with exertion. Patient denies shortness of breath, palpitations, diaphoresis, syncope, edema, orthopnea, PND, claudication.  Past Medical History:  Diagnosis Date   Breast mass, right    Glaucoma    Hyperlipemia    Hypertension    Past Surgical History:  Procedure Laterality Date   BREAST EXCISIONAL BIOPSY Right    BREAST LUMPECTOMY WITH RADIOACTIVE SEED LOCALIZATION Right 06/11/2016   Procedure: BREAST LUMPECTOMY WITH RADIOACTIVE SEED LOCALIZATION;  Surgeon: Paul Toth III, MD;  Location: [facility];  Service: General;  Laterality: Right;   COLONOSCOPY WITH PROPOFOL N/A 03/01/2015   Procedure: COLONOSCOPY WITH PROPOFOL;  Surgeon: Martin K Johnson, MD;  Location: [facility];  Service: Endoscopy;  Laterality: N/A;   EYE SURGERY     Family History  Problem Relation Age of Onset   Hypertension Mother    Diabetes Mother     Social History   Tobacco Use   Smoking status: Never   Smokeless tobacco: Never  Substance Use Topics   Alcohol use: No   Marital Status: Married  ROS  Review of  Systems  Cardiovascular:  Positive for chest pain.   Objective  Blood pressure (!) 153/59, pulse (!) 57, height 5' 2" (1.575 m), weight 109 lb 6.4 oz (49.6 kg), SpO2 97 %. Body mass index is 20.01 kg/m.     06/10/2022   10:16 AM 05/23/2022   10:24 AM 07/17/2016   10:48 AM  Vitals with BMI  Height 5' 2" 5' 2" 5' 2"  Weight 109 lbs 6 oz 108 lbs 109 lbs 14 oz  BMI 20 19.75 20.1  Systolic 153 152 165  Diastolic 59 69 75  Pulse 57 64 66     Physical Exam Vitals reviewed.  HENT:     Head: Normocephalic and atraumatic.  Neck:     Vascular: No carotid bruit.  Cardiovascular:     Rate and Rhythm: Normal rate and regular rhythm.     Pulses: Normal pulses.     Heart sounds: Normal heart sounds. No murmur heard. Pulmonary:     Effort: Pulmonary effort is normal.     Breath sounds: Normal breath sounds.  Abdominal:     General: Bowel sounds are normal.  Musculoskeletal:     Right lower leg: No edema.     Left lower leg: No edema.  Skin:    General: Skin is warm and dry.  Neurological:     Mental Status: She is alert.     Medications and allergies     Allergies  Allergen Reactions   Alendronate Other (See Comments)   Amoxicillin Other (See Comments)    Upsets stomach   Dicyclomine Hcl     Other Reaction(s): dizzy/sleepy   Lisinopril Cough     Medication list after today's encounter   Current Outpatient Medications:    amLODipine (NORVASC) 10 MG tablet, Take 10 mg by mouth daily., Disp: , Rfl:    brimonidine (ALPHAGAN P) 0.1 % SOLN, 1 drop into Left eye Ophthalmic Three times a day, Disp: , Rfl:    Cholecalciferol (VITAMIN D) 50 MCG (2000 UT) CAPS, Take by mouth., Disp: , Rfl:    diclofenac Sodium (VOLTAREN) 1 % GEL, Apply topically 4 (four) times daily., Disp: , Rfl:    dorzolamide (TRUSOPT) 2 % ophthalmic solution, 1 drop 3 (three) times daily., Disp: , Rfl:    dorzolamide-timolol (COSOPT) 22.3-6.8 MG/ML ophthalmic solution, Place 1 drop into both eyes every 12 hours.,  Disp: , Rfl:    escitalopram (LEXAPRO) 5 MG tablet, Take 5 mg by mouth daily., Disp: , Rfl:    fenofibrate (TRICOR) 145 MG tablet, Take 145 mg by mouth daily., Disp: , Rfl:    metFORMIN (GLUCOPHAGE) 500 MG tablet, Take 500 mg by mouth daily., Disp: , Rfl:    metoprolol succinate (TOPROL-XL) 25 MG 24 hr tablet, Take 1 tablet by mouth at bedtime., Disp: , Rfl:    Multiple Vitamins-Minerals (EYE VITAMINS PO), Take 1 tablet by mouth daily., Disp: , Rfl:    nitroGLYCERIN (NITROSTAT) 0.4 MG SL tablet, Place 1 tablet (0.4 mg total) under the tongue every 5 (five) minutes as needed for up to 25 days for chest pain., Disp: 25 tablet, Rfl: 3   pantoprazole (PROTONIX) 40 MG tablet, Take 40 mg by mouth daily., Disp: , Rfl:    rosuvastatin (CRESTOR) 20 MG tablet, Take 1 tablet (20 mg total) by mouth at bedtime., Disp: 90 tablet, Rfl: 3   spironolactone (ALDACTONE) 25 MG tablet, Take 25 mg by mouth daily., Disp: , Rfl:    tizanidine (ZANAFLEX) 2 MG capsule, Take 2 mg by mouth 3 (three) times daily., Disp: , Rfl:    valsartan-hydrochlorothiazide (DIOVAN-HCT) 320-12.5 MG tablet, Take 1 tablet by mouth daily., Disp: , Rfl:    aspirin EC 81 MG tablet, Take 1 tablet (81 mg total) by mouth daily. Swallow whole. (Patient not taking: Reported on 06/10/2022), Disp: 30 tablet, Rfl: 12  Laboratory examination:   Lab Results  Component Value Date   NA 142 06/05/2016   K 4.9 06/05/2016   CO2 31 06/05/2016   GLUCOSE 162 (H) 06/05/2016   BUN 21 (H) 06/05/2016   CREATININE 0.70 06/05/2016   CALCIUM 9.3 06/05/2016   GFRNONAA >60 06/05/2016       Latest Ref Rng & Units 06/05/2016   10:30 AM  CMP  Glucose 65 - 99 mg/dL 162   BUN 6 - 20 mg/dL 21   Creatinine 0.44 - 1.00 mg/dL 0.70   Sodium 135 - 145 mmol/L 142   Potassium 3.5 - 5.1 mmol/L 4.9   Chloride 101 - 111 mmol/L 106   CO2 22 - 32 mmol/L 31   Calcium 8.9 - 10.3 mg/dL 9.3        No data to display          Lipid Panel No results for input(s):  "CHOL", "TRIG", "LDLCALC", "VLDL", "HDL", "CHOLHDL", "LDLDIRECT" in the last 8760 hours.  HEMOGLOBIN A1C No results found for: "HGBA1C", "MPG" TSH No results for input(s): "TSH"   in the last 8760 hours.  External labs:   Labs 02/18/2022: Cholesterol 196 HDL 49 triglycerides 152 LDL 120 vitamin D level 53.2 TSH 2.47 Glucose 122 BUN 25 creatinine 0.83 potassium 4.6 hemoglobin 12.1 hematocrit 36.3 platelets 278    Radiology:    Cardiac Studies:   Echocardiogram 06/03/2022: Normal LV systolic function with visual EF 60-65%. Left ventricle cavity is normal in size. Normal left ventricular wall thickness. Normal global wall motion. Doppler evidence of grade II (pseudonormal) diastolic dysfunction, elevated LAP. Calculated EF 69%. Left atrial cavity is moderately dilated at 45.3 ml/m^2. Trileaflet aortic valve.  Trace aortic regurgitation. Mild aortic valve leaflet calcification. Mildly restricted aortic valve leaflets. Structurally normal mitral valve.  Moderate (Grade II) mitral regurgitation. E-wave dominant mitral inflow. Structurally normal tricuspid valve.  Mild tricuspid regurgitation. Mild pulmonary hypertension. RVSP measures 35 mmHg. The aortic root is normal. Mildly dilated ascending aorta at 3.6 cm. No prior available for comparison.    Lexiscan (Walking) Tetrofosmin stress test 06/05/2022: 1 Day Rest/Stress protocol.  Exercise time 4 minutes, achieved 2.3 METS, 65% of age-predicted maximum heart rate. Stress ECG nondiagnostic as target heart rate was not achieved; however, 1 mm ST depression in inferolateral leads. Small size, mild intensity reversible perfusion defect involving apical septal segment suggestive of ischemia in the LAD distribution. No obvious evidence of prior infarct. Calculated LVEF 70%, wall motion preserved, no regional wall motion abnormalities. No prior studies available for comparison. Low risk study, clinical correlation required.   EKG:    05/23/2022: Sinus Bradycardia, rate 59 bpm. Negative T-waves in precordial leads.  Assessment     ICD-10-CM   1. Essential hypertension  I10 CBC    Basic metabolic panel    2. Chest pain on exertion  R07.9 CBC    Basic metabolic panel    3. Mixed hyperlipidemia  E78.2 CBC    Basic metabolic panel       Orders Placed This Encounter  Procedures   CBC   Basic metabolic panel    Meds ordered this encounter  Medications   aspirin EC 81 MG tablet    Sig: Take 1 tablet (81 mg total) by mouth daily. Swallow whole.    Dispense:  30 tablet    Refill:  12   rosuvastatin (CRESTOR) 20 MG tablet    Sig: Take 1 tablet (20 mg total) by mouth at bedtime.    Dispense:  90 tablet    Refill:  3    There are no discontinued medications.    Recommendations:   Virna C Jha is a 81 y.o.  female with chest pain on exertion  Chest pain on exertion Echocardiogram shows grade II DD and mildly dilated ascending aorta, will repeat in 1 year Patient instructed not to do heavy lifting, heavy exertional activity, swimming until evaluation is complete.  Patient instructed to call if symptoms worse or to go to the ED for further evaluation. Schedule for cardiac catheterization, and possible angioplasty. We discussed regarding risks, benefits, alternatives to this including stress testing, CTA and continued medical therapy. Patient wants to proceed. Understands <1-2% risk of death, stroke, MI, urgent CABG, bleeding, infection, renal failure but not limited to these. SL nitro sent to pharmacy at out last visit   Essential hypertension Continue current cardiac medications. Encourage low-sodium diet, less than 2000 mg daily. Follow-up in 1-2 months or sooner if needed.   Mixed hyperlipidemia On fenofibrate     Alonni Heimsoth, DO, FACC  06/10/2022, 10:37 AM Office: [phone]   Pager: [phone]  

## 2022-06-10 NOTE — H&P (View-Only) (Signed)
Primary Physician/Referring:  Georgann Housekeeper, MD  Patient ID: Tami Mercado, female    DOB: 09-02-1940, 82 y.o.   MRN: [id number]  Chief Complaint  Patient presents with   Chest Pain   Follow-up   Results   HPI:    Tami Mercado  is a 82 y.o. female with past medical history significant for hypertension, hyperlipidemia, and diabetes who is here for a follow-up visit due to chest pain on exertion.  Patient states she works out regularly and has been working out for many years.  She has noticed recently that she develops chest pain with heavier activity and this is new for her. She notices that the chest pain is worse when she is walking uphill.  A few months ago patient did not have any chest pain with exertion but she notices it now when she is on the treadmill as well. Patient is agreeable to proceed with cardiac catheterization given positive stress test and chest pain with exertion. Patient denies shortness of breath, palpitations, diaphoresis, syncope, edema, orthopnea, PND, claudication.  Past Medical History:  Diagnosis Date   Breast mass, right    Glaucoma    Hyperlipemia    Hypertension    Past Surgical History:  Procedure Laterality Date   BREAST EXCISIONAL BIOPSY Right    BREAST LUMPECTOMY WITH RADIOACTIVE SEED LOCALIZATION Right 06/11/2016   Procedure: BREAST LUMPECTOMY WITH RADIOACTIVE SEED LOCALIZATION;  Surgeon: Chevis Pretty III, MD;  Location: [facility];  Service: General;  Laterality: Right;   COLONOSCOPY WITH PROPOFOL N/A 03/01/2015   Procedure: COLONOSCOPY WITH PROPOFOL;  Surgeon: Charolett Bumpers, MD;  Location: [facility];  Service: Endoscopy;  Laterality: N/A;   EYE SURGERY     Family History  Problem Relation Age of Onset   Hypertension Mother    Diabetes Mother     Social History   Tobacco Use   Smoking status: Never   Smokeless tobacco: Never  Substance Use Topics   Alcohol use: No   Marital Status: Married  ROS  Review of  Systems  Cardiovascular:  Positive for chest pain.   Objective  Blood pressure (!) 153/59, pulse (!) 57, height 5\' 2"  (1.575 m), weight 109 lb 6.4 oz (49.6 kg), SpO2 97 %. Body mass index is 20.01 kg/m.     06/10/2022   10:16 AM 05/23/2022   10:24 AM 07/17/2016   10:48 AM  Vitals with BMI  Height 5\' 2"  5\' 2"  5\' 2"   Weight 109 lbs 6 oz 108 lbs 109 lbs 14 oz  BMI 20 19.75 20.1  Systolic 153 152 782  Diastolic 59 69 75  Pulse 57 64 66     Physical Exam Vitals reviewed.  HENT:     Head: Normocephalic and atraumatic.  Neck:     Vascular: No carotid bruit.  Cardiovascular:     Rate and Rhythm: Normal rate and regular rhythm.     Pulses: Normal pulses.     Heart sounds: Normal heart sounds. No murmur heard. Pulmonary:     Effort: Pulmonary effort is normal.     Breath sounds: Normal breath sounds.  Abdominal:     General: Bowel sounds are normal.  Musculoskeletal:     Right lower leg: No edema.     Left lower leg: No edema.  Skin:    General: Skin is warm and dry.  Neurological:     Mental Status: She is alert.     Medications and allergies  Allergies  Allergen Reactions   Alendronate Other (See Comments)   Amoxicillin Other (See Comments)    Upsets stomach   Dicyclomine Hcl     Other Reaction(s): dizzy/sleepy   Lisinopril Cough     Medication list after today's encounter   Current Outpatient Medications:    amLODipine (NORVASC) 10 MG tablet, Take 10 mg by mouth daily., Disp: , Rfl:    brimonidine (ALPHAGAN P) 0.1 % SOLN, 1 drop into Left eye Ophthalmic Three times a day, Disp: , Rfl:    Cholecalciferol (VITAMIN D) 50 MCG (2000 UT) CAPS, Take by mouth., Disp: , Rfl:    diclofenac Sodium (VOLTAREN) 1 % GEL, Apply topically 4 (four) times daily., Disp: , Rfl:    dorzolamide (TRUSOPT) 2 % ophthalmic solution, 1 drop 3 (three) times daily., Disp: , Rfl:    dorzolamide-timolol (COSOPT) 22.3-6.8 MG/ML ophthalmic solution, Place 1 drop into both eyes every 12 hours.,  Disp: , Rfl:    escitalopram (LEXAPRO) 5 MG tablet, Take 5 mg by mouth daily., Disp: , Rfl:    fenofibrate (TRICOR) 145 MG tablet, Take 145 mg by mouth daily., Disp: , Rfl:    metFORMIN (GLUCOPHAGE) 500 MG tablet, Take 500 mg by mouth daily., Disp: , Rfl:    metoprolol succinate (TOPROL-XL) 25 MG 24 hr tablet, Take 1 tablet by mouth at bedtime., Disp: , Rfl:    Multiple Vitamins-Minerals (EYE VITAMINS PO), Take 1 tablet by mouth daily., Disp: , Rfl:    nitroGLYCERIN (NITROSTAT) 0.4 MG SL tablet, Place 1 tablet (0.4 mg total) under the tongue every 5 (five) minutes as needed for up to 25 days for chest pain., Disp: 25 tablet, Rfl: 3   pantoprazole (PROTONIX) 40 MG tablet, Take 40 mg by mouth daily., Disp: , Rfl:    rosuvastatin (CRESTOR) 20 MG tablet, Take 1 tablet (20 mg total) by mouth at bedtime., Disp: 90 tablet, Rfl: 3   spironolactone (ALDACTONE) 25 MG tablet, Take 25 mg by mouth daily., Disp: , Rfl:    tizanidine (ZANAFLEX) 2 MG capsule, Take 2 mg by mouth 3 (three) times daily., Disp: , Rfl:    valsartan-hydrochlorothiazide (DIOVAN-HCT) 320-12.5 MG tablet, Take 1 tablet by mouth daily., Disp: , Rfl:    aspirin EC 81 MG tablet, Take 1 tablet (81 mg total) by mouth daily. Swallow whole. (Patient not taking: Reported on 06/10/2022), Disp: 30 tablet, Rfl: 12  Laboratory examination:   Lab Results  Component Value Date   NA 142 06/05/2016   K 4.9 06/05/2016   CO2 31 06/05/2016   GLUCOSE 162 (H) 06/05/2016   BUN 21 (H) 06/05/2016   CREATININE 0.70 06/05/2016   CALCIUM 9.3 06/05/2016   GFRNONAA >60 06/05/2016       Latest Ref Rng & Units 06/05/2016   10:30 AM  CMP  Glucose 65 - 99 mg/dL 536   BUN 6 - 20 mg/dL 21   Creatinine 4.68 - 1.00 mg/dL 0.32   Sodium 122 - 482 mmol/L 142   Potassium 3.5 - 5.1 mmol/L 4.9   Chloride 101 - 111 mmol/L 106   CO2 22 - 32 mmol/L 31   Calcium 8.9 - 10.3 mg/dL 9.3        No data to display          Lipid Panel No results for input(s):  "CHOL", "TRIG", "LDLCALC", "VLDL", "HDL", "CHOLHDL", "LDLDIRECT" in the last 8760 hours.  HEMOGLOBIN A1C No results found for: "HGBA1C", "MPG" TSH No results for input(s): "TSH"  in the last 8760 hours.  External labs:   Labs 02/18/2022: Cholesterol 196 HDL 49 triglycerides 152 LDL 120 vitamin D level 53.2 TSH 2.47 Glucose 122 BUN 25 creatinine 0.83 potassium 4.6 hemoglobin 12.1 hematocrit 36.3 platelets 278    Radiology:    Cardiac Studies:   Echocardiogram 06/03/2022: Normal LV systolic function with visual EF 60-65%. Left ventricle cavity is normal in size. Normal left ventricular wall thickness. Normal global wall motion. Doppler evidence of grade II (pseudonormal) diastolic dysfunction, elevated LAP. Calculated EF 69%. Left atrial cavity is moderately dilated at 45.3 ml/m^2. Trileaflet aortic valve.  Trace aortic regurgitation. Mild aortic valve leaflet calcification. Mildly restricted aortic valve leaflets. Structurally normal mitral valve.  Moderate (Grade II) mitral regurgitation. E-wave dominant mitral inflow. Structurally normal tricuspid valve.  Mild tricuspid regurgitation. Mild pulmonary hypertension. RVSP measures 35 mmHg. The aortic root is normal. Mildly dilated ascending aorta at 3.6 cm. No prior available for comparison.    Lexiscan (Walking) Tetrofosmin stress test 06/05/2022: 1 Day Rest/Stress protocol.  Exercise time 4 minutes, achieved 2.3 METS, 65% of age-predicted maximum heart rate. Stress ECG nondiagnostic as target heart rate was not achieved; however, 1 mm ST depression in inferolateral leads. Small size, mild intensity reversible perfusion defect involving apical septal segment suggestive of ischemia in the LAD distribution. No obvious evidence of prior infarct. Calculated LVEF 70%, wall motion preserved, no regional wall motion abnormalities. No prior studies available for comparison. Low risk study, clinical correlation required.   EKG:    05/23/2022: Sinus Bradycardia, rate 59 bpm. Negative T-waves in precordial leads.  Assessment     ICD-10-CM   1. Essential hypertension  I10 CBC    Basic metabolic panel    2. Chest pain on exertion  R07.9 CBC    Basic metabolic panel    3. Mixed hyperlipidemia  E78.2 CBC    Basic metabolic panel       Orders Placed This Encounter  Procedures   CBC   Basic metabolic panel    Meds ordered this encounter  Medications   aspirin EC 81 MG tablet    Sig: Take 1 tablet (81 mg total) by mouth daily. Swallow whole.    Dispense:  30 tablet    Refill:  12   rosuvastatin (CRESTOR) 20 MG tablet    Sig: Take 1 tablet (20 mg total) by mouth at bedtime.    Dispense:  90 tablet    Refill:  3    There are no discontinued medications.    Recommendations:   YANETTE WIESNER is a 82 y.o.  female with chest pain on exertion  Chest pain on exertion Echocardiogram shows grade II DD and mildly dilated ascending aorta, will repeat in 1 year Patient instructed not to do heavy lifting, heavy exertional activity, swimming until evaluation is complete.  Patient instructed to call if symptoms worse or to go to the ED for further evaluation. Schedule for cardiac catheterization, and possible angioplasty. We discussed regarding risks, benefits, alternatives to this including stress testing, CTA and continued medical therapy. Patient wants to proceed. Understands <1-2% risk of death, stroke, MI, urgent CABG, bleeding, infection, renal failure but not limited to these. SL nitro sent to pharmacy at out last visit   Essential hypertension Continue current cardiac medications. Encourage low-sodium diet, less than 2000 mg daily. Follow-up in 1-2 months or sooner if needed.   Mixed hyperlipidemia On fenofibrate     Clotilde Dieter, DO, [facility]  06/10/2022, 10:37 AM Office: [phone]  Pager: [phone]

## 2022-06-11 ENCOUNTER — Ambulatory Visit: Payer: Medicare Other | Admitting: Internal Medicine

## 2022-06-12 DIAGNOSIS — I10 Essential (primary) hypertension: Secondary | ICD-10-CM | POA: Diagnosis not present

## 2022-06-12 DIAGNOSIS — E782 Mixed hyperlipidemia: Secondary | ICD-10-CM | POA: Diagnosis not present

## 2022-06-12 DIAGNOSIS — R079 Chest pain, unspecified: Secondary | ICD-10-CM | POA: Diagnosis not present

## 2022-06-13 LAB — BASIC METABOLIC PANEL
BUN/Creatinine Ratio: 36 — ABNORMAL HIGH (ref 12–28)
BUN: 29 mg/dL — ABNORMAL HIGH (ref 8–27)
CO2: 24 mmol/L (ref 20–29)
Calcium: 10.3 mg/dL (ref 8.7–10.3)
Chloride: 103 mmol/L (ref 96–106)
Creatinine, Ser: 0.81 mg/dL (ref 0.57–1.00)
Glucose: 117 mg/dL — ABNORMAL HIGH (ref 70–99)
Potassium: 4.5 mmol/L (ref 3.5–5.2)
Sodium: 141 mmol/L (ref 134–144)
eGFR: 73 mL/min/{1.73_m2} (ref >59)

## 2022-06-13 LAB — CBC
Hematocrit: 40.8 % (ref 34.0–46.6)
Hemoglobin: 12.9 g/dL (ref 11.1–15.9)
MCH: 28 pg (ref 26.6–33.0)
MCHC: 31.6 g/dL (ref 31.5–35.7)
MCV: 89 fL (ref 79–97)
Platelets: 269 10*3/uL (ref 150–450)
RBC: 4.61 x10E6/uL (ref 3.77–5.28)
RDW: 14 % (ref 11.7–15.4)
WBC: 4.9 10*3/uL (ref 3.4–10.8)

## 2022-06-20 ENCOUNTER — Encounter (HOSPITAL_COMMUNITY)
Admission: RE | Disposition: A | Discharge status: Home / Self Care | Payer: Self-pay | Source: Home / Self Care | Attending: Cardiology

## 2022-06-20 ENCOUNTER — Other Ambulatory Visit (HOSPITAL_COMMUNITY): Payer: Self-pay

## 2022-06-20 ENCOUNTER — Ambulatory Visit (HOSPITAL_COMMUNITY)
Admission: RE | Admit: 2022-06-20 | Discharge: 2022-06-20 | Disposition: A | Discharge status: Home / Self Care | Payer: Medicare Other | Attending: Cardiology | Admitting: Cardiology

## 2022-06-20 ENCOUNTER — Encounter (HOSPITAL_COMMUNITY): Payer: Self-pay | Admitting: Cardiology

## 2022-06-20 ENCOUNTER — Other Ambulatory Visit: Payer: Self-pay

## 2022-06-20 DIAGNOSIS — I2510 Atherosclerotic heart disease of native coronary artery without angina pectoris: Secondary | ICD-10-CM | POA: Insufficient documentation

## 2022-06-20 DIAGNOSIS — Z79899 Other long term (current) drug therapy: Secondary | ICD-10-CM | POA: Diagnosis not present

## 2022-06-20 DIAGNOSIS — R079 Chest pain, unspecified: Secondary | ICD-10-CM | POA: Diagnosis not present

## 2022-06-20 DIAGNOSIS — E782 Mixed hyperlipidemia: Secondary | ICD-10-CM | POA: Diagnosis not present

## 2022-06-20 DIAGNOSIS — I25118 Atherosclerotic heart disease of native coronary artery with other forms of angina pectoris: Secondary | ICD-10-CM

## 2022-06-20 DIAGNOSIS — I10 Essential (primary) hypertension: Secondary | ICD-10-CM | POA: Insufficient documentation

## 2022-06-20 DIAGNOSIS — E119 Type 2 diabetes mellitus without complications: Secondary | ICD-10-CM | POA: Diagnosis not present

## 2022-06-20 HISTORY — PX: LEFT HEART CATH AND CORONARY ANGIOGRAPHY: CATH118249

## 2022-06-20 SURGERY — LEFT HEART CATH AND CORONARY ANGIOGRAPHY
Anesthesia: LOCAL

## 2022-06-20 MED ORDER — ONDANSETRON HCL 4 MG/2ML IJ SOLN: 4.0000 mg | Freq: Four times a day (QID) | INTRAMUSCULAR | Status: DC | PRN

## 2022-06-20 MED ORDER — SODIUM CHLORIDE 0.9% FLUSH: 3.0000 mL | Freq: Two times a day (BID) | INTRAVENOUS | Status: DC

## 2022-06-20 MED ORDER — MIDAZOLAM HCL 2 MG/2ML IJ SOLN
INTRAMUSCULAR | Status: DC | PRN
  Administered 2022-06-20 (×2): 1 mg via INTRAVENOUS

## 2022-06-20 MED ORDER — ASPIRIN 81 MG PO CHEW
81.0000 mg | CHEWABLE_TABLET | Freq: Every day | ORAL | 1 refills | Status: AC
  Filled 2022-06-20: qty 30, 30d supply, fill #0

## 2022-06-20 MED ORDER — ASPIRIN 81 MG PO TBEC: 81.0000 mg | DELAYED_RELEASE_TABLET | Freq: Every day | ORAL | 12 refills | Status: DC

## 2022-06-20 MED ORDER — MIDAZOLAM HCL 2 MG/2ML IJ SOLN
INTRAMUSCULAR | Status: AC
  Filled 2022-06-20: qty 2

## 2022-06-20 MED ORDER — HEPARIN SODIUM (PORCINE) 1000 UNIT/ML IJ SOLN
INTRAMUSCULAR | Status: AC
  Filled 2022-06-20: qty 10

## 2022-06-20 MED ORDER — IOHEXOL 350 MG/ML SOLN
INTRAVENOUS | Status: DC | PRN
  Administered 2022-06-20: 40 mL

## 2022-06-20 MED ORDER — SODIUM CHLORIDE 0.9 % WEIGHT BASED INFUSION
3.0000 mL/kg/h | INTRAVENOUS | Status: AC
  Administered 2022-06-20: 3 mL/kg/h via INTRAVENOUS

## 2022-06-20 MED ORDER — ASPIRIN 81 MG PO CHEW: 81.0000 mg | CHEWABLE_TABLET | ORAL | Status: DC

## 2022-06-20 MED ORDER — SODIUM CHLORIDE 0.9 % WEIGHT BASED INFUSION
3.0000 mL/kg/h | INTRAVENOUS | Status: DC
Start: 2022-06-21 — End: 2022-06-20

## 2022-06-20 MED ORDER — SODIUM CHLORIDE 0.9% FLUSH: 3.0000 mL | INTRAVENOUS | Status: DC | PRN

## 2022-06-20 MED ORDER — FENTANYL CITRATE (PF) 100 MCG/2ML IJ SOLN
INTRAMUSCULAR | Status: AC
  Filled 2022-06-20: qty 2

## 2022-06-20 MED ORDER — SODIUM CHLORIDE 0.9 % WEIGHT BASED INFUSION: 1.0000 mL/kg/h | INTRAVENOUS | Status: DC

## 2022-06-20 MED ORDER — LIDOCAINE HCL (PF) 1 % IJ SOLN
INTRAMUSCULAR | Status: DC | PRN
  Administered 2022-06-20: 5 mL

## 2022-06-20 MED ORDER — SODIUM CHLORIDE 0.9 % IV SOLN: 250.0000 mL | INTRAVENOUS | Status: DC | PRN

## 2022-06-20 MED ORDER — ASPIRIN 81 MG PO CHEW
81.0000 mg | CHEWABLE_TABLET | ORAL | Status: AC
  Administered 2022-06-20: 81 mg via ORAL
  Filled 2022-06-20: qty 1

## 2022-06-20 MED ORDER — NITROGLYCERIN 1 MG/10 ML FOR IR/CATH LAB
INTRA_ARTERIAL | Status: DC | PRN
  Administered 2022-06-20: 200 ug via INTRACORONARY

## 2022-06-20 MED ORDER — VERAPAMIL HCL 2.5 MG/ML IV SOLN
INTRAVENOUS | Status: AC
  Filled 2022-06-20: qty 2

## 2022-06-20 MED ORDER — ACETAMINOPHEN 325 MG PO TABS: 650.0000 mg | ORAL_TABLET | ORAL | Status: DC | PRN

## 2022-06-20 MED ORDER — FENTANYL CITRATE (PF) 100 MCG/2ML IJ SOLN
INTRAMUSCULAR | Status: DC | PRN
  Administered 2022-06-20 (×3): 25 ug via INTRAVENOUS

## 2022-06-20 MED ORDER — HEPARIN (PORCINE) IN NACL 1000-0.9 UT/500ML-% IV SOLN
INTRAVENOUS | Status: DC | PRN
  Administered 2022-06-20 (×2): 500 mL

## 2022-06-20 MED ORDER — LABETALOL HCL 5 MG/ML IV SOLN: 10.0000 mg | INTRAVENOUS | Status: DC | PRN

## 2022-06-20 MED ORDER — LIDOCAINE HCL (PF) 1 % IJ SOLN
INTRAMUSCULAR | Status: AC
  Filled 2022-06-20: qty 30

## 2022-06-20 MED ORDER — NITROGLYCERIN 1 MG/10 ML FOR IR/CATH LAB
INTRA_ARTERIAL | Status: AC
  Filled 2022-06-20: qty 10

## 2022-06-20 MED ORDER — HEPARIN SODIUM (PORCINE) 1000 UNIT/ML IJ SOLN
INTRAMUSCULAR | Status: DC | PRN
  Administered 2022-06-20: 3500 [IU] via INTRAVENOUS

## 2022-06-20 MED ORDER — METOPROLOL SUCCINATE ER 50 MG PO TB24
50.0000 mg | ORAL_TABLET | Freq: Every day | ORAL | 1 refills | Status: AC
  Filled 2022-06-20: qty 30, 30d supply, fill #0

## 2022-06-20 MED ORDER — SODIUM CHLORIDE 0.9 % WEIGHT BASED INFUSION
1.0000 mL/kg/h | INTRAVENOUS | Status: DC
  Administered 2022-06-20: 1 mL/kg/h via INTRAVENOUS

## 2022-06-20 SURGICAL SUPPLY — 12 items
BAND CMPR LRG ZPHR (HEMOSTASIS) ×1
BAND ZEPHYR COMPRESS 30 LONG (HEMOSTASIS) IMPLANT
CATH OPTITORQUE TIG 4.0 5F (CATHETERS) IMPLANT
DEVICE RAD COMP TR BAND LRG (VASCULAR PRODUCTS) IMPLANT
GLIDESHEATH SLEND A-KIT 6F 22G (SHEATH) IMPLANT
GUIDEWIRE ANGLED .035X150CM (WIRE) IMPLANT
GUIDEWIRE INQWIRE 1.5J.035X260 (WIRE) IMPLANT
INQWIRE 1.5J .035X260CM (WIRE) ×1
KIT HEART LEFT (KITS) ×2 IMPLANT
PACK CARDIAC CATHETERIZATION (CUSTOM PROCEDURE TRAY) ×2 IMPLANT
TRANSDUCER W/STOPCOCK (MISCELLANEOUS) ×2 IMPLANT
TUBING CIL FLEX 10 FLL-RA (TUBING) ×2 IMPLANT

## 2022-06-20 NOTE — Progress Notes (Signed)
TR BAND REMOVAL  LOCATION:    right radial  DEFLATED PER PROTOCOL:    Yes.    TIME BAND OFF / DRESSING APPLIED: 06/20/22 at 1115   SITE UPON ARRIVAL:    Level 0  SITE AFTER BAND REMOVAL:    Level 0  CIRCULATION SENSATION AND MOVEMENT:    Within Normal Limits   Yes.    COMMENTS:

## 2022-06-20 NOTE — Interval H&P Note (Signed)
History and Physical Interval Note:  06/20/2022 8:26 AM  Tami Mercado  has presented today for surgery, with the diagnosis of chest pain.  The various methods of treatment have been discussed with the patient and family. After consideration of risks, benefits and other options for treatment, the patient has consented to  Procedure(s): LEFT HEART CATH AND CORONARY ANGIOGRAPHY (N/A) and possible coronary angioplasty as a surgical intervention.  The patient's history has been reviewed, patient examined, no change in status, stable for surgery.  I have reviewed the patient's chart and labs.  Questions were answered to the patient's satisfaction.    Cath Lab Visit (complete for each Cath Lab visit)  Clinical Evaluation Leading to the Procedure:   ACS: No.  Non-ACS:    Anginal Classification: CCS II  Anti-ischemic medical therapy: Minimal Therapy (1 class of medications)  Non-Invasive Test Results: Low-risk stress test findings: cardiac mortality <1%/year  Prior CABG: No previous CABG       Yates Decamp

## 2022-06-23 ENCOUNTER — Ambulatory Visit: Payer: Medicare Other

## 2022-06-25 NOTE — Progress Notes (Signed)
[address]       [place] [address]             [phone]        Tami Mercado [facility] Medical Record #[id number] Date of Birth: [birth year]  Referring: Yates Decamp, MD Primary Care: Georgann Housekeeper, MD Primary Cardiologist:None  Chief Complaint:    Chief Complaint  Patient presents with   Coronary Artery Disease    New patient consult, ECHO 4/2, CATH 4/19    History of Present Illness:     82yo female referred for surgical evaluation of LAD, and diag disease.  Her evaluation was prompted by typical chest pain when she walked up a hill.  She currently exercises 3X/week, and walks about 1.11miles at a time without any symptoms.     Past Medical and Surgical History: Previous Chest Surgery: no Previous Chest Radiation: no Diabetes Mellitus: yes.  HbA1C pending Creatinine: pending  Past Medical History:  Diagnosis Date   Breast mass, right    Glaucoma    Hyperlipemia    Hypertension     Past Surgical History:  Procedure Laterality Date   BREAST EXCISIONAL BIOPSY Right    BREAST LUMPECTOMY WITH RADIOACTIVE SEED LOCALIZATION Right 06/11/2016   Procedure: BREAST LUMPECTOMY WITH RADIOACTIVE SEED LOCALIZATION;  Surgeon: Chevis Pretty III, MD;  Location: [facility];  Service: General;  Laterality: Right;   COLONOSCOPY WITH PROPOFOL N/A 03/01/2015   Procedure: COLONOSCOPY WITH PROPOFOL;  Surgeon: Charolett Bumpers, MD;  Location: [facility];  Service: Endoscopy;  Laterality: N/A;   EYE SURGERY     LEFT HEART CATH AND CORONARY ANGIOGRAPHY N/A 06/20/2022   Procedure: LEFT HEART CATH AND CORONARY ANGIOGRAPHY;  Surgeon: Yates Decamp, MD;  Location: [facility];  Service: Cardiovascular;  Laterality: N/A;     Social History   Tobacco Use  Smoking Status Never  Smokeless Tobacco Never    Social History   Substance and Sexual Activity  Alcohol Use No     Allergies  Allergen Reactions   Alendronate Other (See Comments)    Amoxicillin Other (See Comments)    Upsets stomach   Dicyclomine Hcl     Other Reaction(s): dizzy/sleepy   Lisinopril Cough     Current Outpatient Medications  Medication Sig Dispense Refill   amLODipine (NORVASC) 10 MG tablet Take 10 mg by mouth at bedtime.     Apoaequorin (PREVAGEN PO) Take 1 tablet by mouth daily.     aspirin (ASPIRIN CHILDRENS) 81 MG chewable tablet Chew 1 tablet (81 mg total) by mouth daily. 30 tablet 1   brimonidine (ALPHAGAN P) 0.1 % SOLN Place 1 drop into the left eye in the morning and at bedtime.     dorzolamide (TRUSOPT) 2 % ophthalmic solution Place 1 drop into both eyes 2 (two) times daily.     escitalopram (LEXAPRO) 5 MG tablet Take 2.5 mg by mouth daily.     fenofibrate (TRICOR) 145 MG tablet Take 145 mg by mouth daily.     metFORMIN (GLUCOPHAGE) 500 MG tablet Take 500 mg by mouth daily.     metoprolol succinate (TOPROL-XL) 50 MG 24 hr tablet Take 1 tablet (50 mg total) by mouth at bedtime. 30 tablet 1   Multiple Vitamins-Minerals (PRESERVISION AREDS 2 PO) Take 2 capsules by mouth daily.     pantoprazole (PROTONIX) 40 MG tablet Take 40 mg by mouth daily.     rosuvastatin (CRESTOR) 20 MG tablet  Take 1 tablet (20 mg total) by mouth at bedtime. 90 tablet 3   valsartan-hydrochlorothiazide (DIOVAN-HCT) 320-12.5 MG tablet Take 1 tablet by mouth daily.     nitroGLYCERIN (NITROSTAT) 0.4 MG SL tablet Place 1 tablet (0.4 mg total) under the tongue every 5 (five) minutes as needed for up to 25 days for chest pain. 25 tablet 3   No current facility-administered medications for this visit.    (Not in a hospital admission)   Family History  Problem Relation Age of Onset   Hypertension Mother    Diabetes Mother      Review of Systems:   Review of Systems  Constitutional:  Negative for malaise/fatigue and weight loss.  Respiratory:  Negative for cough and shortness of breath.   Cardiovascular:  Negative for chest pain, palpitations and orthopnea.   Neurological: Negative.   Psychiatric/Behavioral:  The patient is nervous/anxious.       Physical Exam: BP (!) 197/91 (BP Location: Right Arm, Patient Position: Sitting, Cuff Size: Small)   Pulse (!) 55   Resp 20   Wt 110 lb 8 oz (50.1 kg)   SpO2 97%   BMI 20.21 kg/m  Physical Exam Constitutional:      General: She is not in acute distress.    Appearance: Normal appearance. She is normal weight. She is not ill-appearing.  HENT:     Head: Normocephalic and atraumatic.  Eyes:     Extraocular Movements: Extraocular movements intact.  Cardiovascular:     Rate and Rhythm: Bradycardia present.  Pulmonary:     Effort: Pulmonary effort is normal. No respiratory distress.  Abdominal:     General: Abdomen is flat. There is no distension.  Musculoskeletal:        General: Normal range of motion.     Cervical back: Normal range of motion.  Skin:    General: Skin is warm and dry.  Neurological:     General: No focal deficit present.     Mental Status: She is alert and oriented to person, place, and time.       Diagnostic Studies & Laboratory data:    Left Heart Catherization:  Intervention   I have independently reviewed the above radiologic studies and discussed with the patient   Recent Lab Findings: Lab Results  Component Value Date   WBC 4.9 06/12/2022   HGB 12.9 06/12/2022   HCT 40.8 06/12/2022   PLT 269 06/12/2022   GLUCOSE 117 (H) 06/12/2022   NA 141 06/12/2022   K 4.5 06/12/2022   CL 103 06/12/2022   CREATININE 0.81 06/12/2022   BUN 29 (H) 06/12/2022   CO2 24 06/12/2022   Lexiscan (Walking) Tetrofosmin stress test 06/05/2022: 1 Day Rest/Stress protocol.  Exercise time 4 minutes, achieved 2.3 METS, 65% of age-predicted maximum heart rate. Stress ECG nondiagnostic as target heart rate was not achieved; however, 1 mm ST depression in inferolateral leads. Small size, mild intensity reversible perfusion defect involving apical septal segment suggestive of  ischemia in the LAD distribution. No obvious evidence of prior infarct. Calculated LVEF 70%, wall motion preserved, no regional wall motion abnormalities. No prior studies available for comparison. Low risk study, clinical correlation required.   Assessment / Plan:   82yo female with LAD, and diagonal disease.  Echo pending.  Consideration for Mid CAB, but LAD is small, and diagonal should be bypassed as well.  Based on the fact that she is asymptomatic, the benefit of surgical revascularization is questionable.  She stated that  she did already undergo a treadmill stress test, and did not experience any symptoms.  I will need to speak with Dr. Jacinto Halim to confirm this.    If ultimately she requires surgery. I think that a standard approach would be best given that there is diagonal disease, and it is a similar sized vessel as the LAD.       I  spent 40 minutes counseling the patient face to face.   Corliss Skains 06/27/2022 9:52 AM

## 2022-06-27 ENCOUNTER — Encounter: Payer: Self-pay | Admitting: Thoracic Surgery (Cardiothoracic Vascular Surgery)

## 2022-06-27 ENCOUNTER — Institutional Professional Consult (permissible substitution): Payer: Medicare Other | Admitting: Thoracic Surgery (Cardiothoracic Vascular Surgery)

## 2022-06-27 VITALS — BP 197/91 | HR 55 | Resp 20 | Wt 110.5 lb

## 2022-06-27 DIAGNOSIS — I25118 Atherosclerotic heart disease of native coronary artery with other forms of angina pectoris: Secondary | ICD-10-CM

## 2022-07-01 DIAGNOSIS — I10 Essential (primary) hypertension: Secondary | ICD-10-CM | POA: Diagnosis not present

## 2022-07-01 DIAGNOSIS — I2510 Atherosclerotic heart disease of native coronary artery without angina pectoris: Secondary | ICD-10-CM | POA: Diagnosis not present

## 2022-07-04 ENCOUNTER — Ambulatory Visit: Payer: Medicare Other | Admitting: Internal Medicine

## 2022-07-12 DIAGNOSIS — H401133 Primary open-angle glaucoma, bilateral, severe stage: Secondary | ICD-10-CM | POA: Diagnosis not present

## 2022-07-15 ENCOUNTER — Ambulatory Visit: Payer: Medicare Other | Admitting: Cardiology

## 2022-07-15 ENCOUNTER — Encounter: Payer: Self-pay | Admitting: Cardiology

## 2022-07-15 VITALS — BP 186/83 | HR 63 | Resp 16 | Ht 62.0 in | Wt 108.0 lb

## 2022-07-15 DIAGNOSIS — E782 Mixed hyperlipidemia: Secondary | ICD-10-CM | POA: Diagnosis not present

## 2022-07-15 DIAGNOSIS — I10 Essential (primary) hypertension: Secondary | ICD-10-CM

## 2022-07-15 DIAGNOSIS — I25118 Atherosclerotic heart disease of native coronary artery with other forms of angina pectoris: Secondary | ICD-10-CM

## 2022-07-15 MED ORDER — CLOPIDOGREL BISULFATE 75 MG PO TABS
75.0000 mg | ORAL_TABLET | Freq: Every day | ORAL | 3 refills | Status: DC
Start: 2022-07-15 — End: 2023-07-10

## 2022-07-15 MED ORDER — ISOSORBIDE MONONITRATE ER 60 MG PO TB24: 30.0000 mg | ORAL_TABLET | Freq: Every day | ORAL | 2 refills | Status: DC

## 2022-07-15 MED ORDER — HYDRALAZINE HCL 50 MG PO TABS
50.0000 mg | ORAL_TABLET | Freq: Three times a day (TID) | ORAL | 2 refills | Status: DC
Start: 2022-07-15 — End: 2022-10-10

## 2022-07-15 NOTE — Progress Notes (Unsigned)
Primary Physician/Referring:  Georgann Housekeeper, MD  Patient ID: Tami Mercado, female    DOB: 07-Dec-1940, 82 y.o.   MRN: [id number]  Chief Complaint  Patient presents with   General  questions   Hypertension   HPI:    Tami Mercado  is a 82 y.o.  Asian female with past medical history significant for hypertension, hyperlipidemia, and diabetes, seen in the office in April 2024 with exertional chest pain suggestive of angina pectoris, underwent coronary angiography for abnormal stress test on 06/21/2022 and was recommended CABG.  She now presents for follow-up.    Patient states that although she has not resumed walking, she has returned to her routine exercise including yoga and kickboxing.  States that she has not had any chest pain or dyspnea.  She is tolerating all her medications well.  She was evaluated by CT surgery, recommended further discussions with management of CAD.  Patient is wondering whether she should have surgery or continue medical therapy.  Past Medical History:  Diagnosis Date   Breast mass, right    Glaucoma    Hyperlipemia    Hypertension    Past Surgical History:  Procedure Laterality Date   BREAST EXCISIONAL BIOPSY Right    BREAST LUMPECTOMY WITH RADIOACTIVE SEED LOCALIZATION Right 06/11/2016   Procedure: BREAST LUMPECTOMY WITH RADIOACTIVE SEED LOCALIZATION;  Surgeon: Chevis Pretty III, MD;  Location: [facility];  Service: General;  Laterality: Right;   COLONOSCOPY WITH PROPOFOL N/A 03/01/2015   Procedure: COLONOSCOPY WITH PROPOFOL;  Surgeon: Charolett Bumpers, MD;  Location: [facility];  Service: Endoscopy;  Laterality: N/A;   EYE SURGERY     LEFT HEART CATH AND CORONARY ANGIOGRAPHY N/A 06/20/2022   Procedure: LEFT HEART CATH AND CORONARY ANGIOGRAPHY;  Surgeon: Yates Decamp, MD;  Location: [facility];  Service: Cardiovascular;  Laterality: N/A;   Family History  Problem Relation Age of Onset   Hypertension Mother    Diabetes Mother      Social History   Tobacco Use   Smoking status: Never   Smokeless tobacco: Never  Substance Use Topics   Alcohol use: No   Marital Status: Married  ROS  Review of Systems  Cardiovascular:  Negative for chest pain, dyspnea on exertion and leg swelling.   Objective      07/15/2022    2:17 PM 07/15/2022    2:14 PM 06/27/2022    9:11 AM  Vitals with BMI  Weight   110 lbs 8 oz  BMI   20.21  Systolic 186 200 696  Diastolic 83 80 91  Pulse 63 76 55   SpO2: 96 %  Physical Exam Neck:     Vascular: No carotid bruit or JVD.  Cardiovascular:     Rate and Rhythm: Normal rate and regular rhythm.     Pulses: Intact distal pulses.     Heart sounds: Normal heart sounds. No murmur heard.    No gallop.  Pulmonary:     Effort: Pulmonary effort is normal.     Breath sounds: Normal breath sounds.  Abdominal:     General: Bowel sounds are normal.     Palpations: Abdomen is soft.  Musculoskeletal:     Right lower leg: No edema.     Left lower leg: No edema.     Laboratory examination:   Recent Labs    06/12/22 0910  NA 141  K 4.5  CL 103  CO2 24  GLUCOSE 117*  BUN  29*  CREATININE 0.81  CALCIUM 10.3    Lab Results  Component Value Date   GLUCOSE 117 (H) 06/12/2022   NA 141 06/12/2022   K 4.5 06/12/2022   CL 103 06/12/2022   CO2 24 06/12/2022   BUN 29 (H) 06/12/2022   CREATININE 0.81 06/12/2022   EGFR 73 06/12/2022   CALCIUM 10.3 06/12/2022   ANIONGAP 5 06/05/2016      No results found for: "ALT", "AST", "GGT", "ALKPHOS", "BILITOT"      No data to display          Lipid Panel No results for input(s): "CHOL", "TRIG", "LDLCALC", "VLDL", "HDL", "CHOLHDL", "LDLDIRECT" in the last 8760 hours.  HEMOGLOBIN A1C No results found for: "HGBA1C", "MPG" TSH No results for input(s): "TSH" in the last 8760 hours.  External labs:   Labs 02/18/2022:  Cholesterol 196 HDL 49 triglycerides 152 LDL 120 vitamin D level 53.2  TSH 2.47  Glucose 122 BUN 25 creatinine  0.83 potassium 4.6 hemoglobin 12.1 hematocrit 36.3 platelets 278  Radiology:    Cardiac Studies:   PCV ECHOCARDIOGRAM COMPLETE 06/03/2022  Narrative Echocardiogram 06/03/2022: Normal LV systolic function with visual EF 60-65%. Left ventricle cavity is normal in size. Normal left ventricular wall thickness. Normal global wall motion. Doppler evidence of grade II (pseudonormal) diastolic dysfunction, elevated LAP. Calculated EF 69%. Left atrial cavity is moderately dilated at 45.3 ml/m^2. Trileaflet aortic valve.  Trace aortic regurgitation. Mild aortic valve leaflet calcification. Mildly restricted aortic valve leaflets. Structurally normal mitral valve.  Moderate (Grade II) mitral regurgitation. E-wave dominant mitral inflow. Structurally normal tricuspid valve.  Mild tricuspid regurgitation. Mild pulmonary hypertension. RVSP measures 35 mmHg. The aortic root is normal. Mildly dilated ascending aorta at 3.6 cm. No prior available for comparison.    PCV MYOCARDIAL PERFUSION WO LEXISCAN 06/05/2022  Narrative Lexiscan (Walking) Tetrofosmin stress test 06/05/2022: 1 Day Rest/Stress protocol. Exercise time 4 minutes, achieved 2.3 METS, 65% of age-predicted maximum heart rate. Stress ECG nondiagnostic as target heart rate was not achieved; however, 1 mm ST depression in inferolateral leads. Small size, mild intensity reversible perfusion defect involving apical septal segment suggestive of ischemia in the LAD distribution. No obvious evidence of prior infarct. Calculated LVEF 70%, wall motion preserved, no regional wall motion abnormalities. No prior studies available for comparison. Low risk study, clinical correlation required.   Left Heart Catheterization 06/20/22:  LV: 159/1, EDP 11 mmHg.  Ao 158/60, mean 98 mmHg.  No pressure gradient across aortic valve.   LM: It is a large-caliber vessel proximally, distal left main has a 30 to 40% stenosis. LAD: Very large vessel, complex  proximal diffuse mild to moderately calcific high-grade stenosis in the ostium extending to the proximal segment of the LAD with TIMI II flow into the LAD.  Gives origin to a very large D1 with a high-grade 95% stenosis in the proximal segment. RI: Large vessel, very mild disease noted. LCx: Moderate caliber vessel.  Gives origin to 3 obtuse marginals.  Mild disease is present. RCA: Large-caliber vessel and a dominant vessel.  Mild disease is noted.      Impression: Extremely complex and high-grade stenosis involving the ostial LAD with TIMI II flow through the LAD and a high-grade D1 stenosis, early bifurcation, with a proximal 95% stenosis with TIMI-3 flow.   Recommendation: Patient needs urgent evaluation for CABG.  EKG:   05/23/2022: Sinus Bradycardia, rate 59 bpm. Negative T-waves in precordial leads.    Medications and allergies   Allergies  Allergen Reactions   Alendronate Other (See Comments)   Amoxicillin Other (See Comments)    Upsets stomach   Dicyclomine Hcl     Other Reaction(s): dizzy/sleepy   Lisinopril Cough     Medication list   Current Outpatient Medications:    amLODipine (NORVASC) 10 MG tablet, Take 10 mg by mouth at bedtime., Disp: , Rfl:    Apoaequorin (PREVAGEN PO), Take 1 tablet by mouth daily., Disp: , Rfl:    aspirin (ASPIRIN CHILDRENS) 81 MG chewable tablet, Chew 1 tablet (81 mg total) by mouth daily., Disp: 30 tablet, Rfl: 1   brimonidine (ALPHAGAN P) 0.1 % SOLN, Place 1 drop into the left eye in the morning and at bedtime., Disp: , Rfl:    clopidogrel (PLAVIX) 75 MG tablet, Take 1 tablet (75 mg total) by mouth daily., Disp: 90 tablet, Rfl: 3   dorzolamide-timolol (COSOPT) 2-0.5 % ophthalmic solution, Place 1 drop into both eyes 2 (two) times daily., Disp: , Rfl:    escitalopram (LEXAPRO) 5 MG tablet, Take 2.5 mg by mouth daily., Disp: , Rfl:    fenofibrate (TRICOR) 145 MG tablet, Take 145 mg by mouth daily., Disp: , Rfl:    hydrALAZINE (APRESOLINE) 50  MG tablet, Take 1 tablet (50 mg total) by mouth 3 (three) times daily., Disp: 90 tablet, Rfl: 2   metFORMIN (GLUCOPHAGE) 500 MG tablet, Take 500 mg by mouth daily., Disp: , Rfl:    metoprolol succinate (TOPROL-XL) 50 MG 24 hr tablet, Take 1 tablet (50 mg total) by mouth at bedtime., Disp: 30 tablet, Rfl: 1   Multiple Vitamins-Minerals (PRESERVISION AREDS 2 PO), Take 2 capsules by mouth daily., Disp: , Rfl:    nitroGLYCERIN (NITROSTAT) 0.4 MG SL tablet, Place 1 tablet (0.4 mg total) under the tongue every 5 (five) minutes as needed for up to 25 days for chest pain., Disp: 25 tablet, Rfl: 3   pantoprazole (PROTONIX) 40 MG tablet, Take 40 mg by mouth daily., Disp: , Rfl:    rosuvastatin (CRESTOR) 20 MG tablet, Take 1 tablet (20 mg total) by mouth at bedtime., Disp: 90 tablet, Rfl: 3   valsartan-hydrochlorothiazide (DIOVAN-HCT) 320-12.5 MG tablet, Take 1 tablet by mouth daily., Disp: , Rfl:    isosorbide mononitrate (IMDUR) 60 MG 24 hr tablet, Take 0.5 tablets (30 mg total) by mouth daily., Disp: 30 tablet, Rfl: 2  Assessment     ICD-10-CM   1. Coronary artery disease of native artery of native heart with stable angina pectoris (HCC)  I25.118 clopidogrel (PLAVIX) 75 MG tablet    isosorbide mononitrate (IMDUR) 60 MG 24 hr tablet    2. Essential hypertension  I10 hydrALAZINE (APRESOLINE) 50 MG tablet    3. Mixed hyperlipidemia  E78.2 Lipid Panel With LDL/HDL Ratio    Lipoprotein A (LPA)       Orders Placed This Encounter  Procedures   Lipid Panel With LDL/HDL Ratio   Lipoprotein A (LPA)    Meds ordered this encounter  Medications   clopidogrel (PLAVIX) 75 MG tablet    Sig: Take 1 tablet (75 mg total) by mouth daily.    Dispense:  90 tablet    Refill:  3   isosorbide mononitrate (IMDUR) 60 MG 24 hr tablet    Sig: Take 0.5 tablets (30 mg total) by mouth daily.    Dispense:  30 tablet    Refill:  2   hydrALAZINE (APRESOLINE) 50 MG tablet    Sig: Take 1 tablet (50 mg total) by mouth  3  (three) times daily.    Dispense:  90 tablet    Refill:  2    Medications Discontinued During This Encounter  Medication Reason   dorzolamide (TRUSOPT) 2 % ophthalmic solution Change in therapy   isosorbide mononitrate (IMDUR) 30 MG 24 hr tablet Reorder     Recommendations:   MIKITA GUENETTE is a 82 y.o. Asian female with past medical history significant for hypertension, hyperlipidemia, and diabetes, seen in the office in April 2024 with exertional chest pain suggestive of angina pectoris, underwent coronary angiography for abnormal stress test on 06/21/2022 and was recommended CABG.  She now presents for follow-up.    1. Coronary artery disease of native artery of native heart with stable angina pectoris [facility]) Patient has extremely complex high-grade stenosis of the entire proximal LAD including high origin of D1, needs complex bifurcation stenting.  She was evaluated by surgery, as Dr. Cliffton Asters felt she was asymptomatic, recommended continued medical therapy and to discuss with me.  However patient had classic symptoms of exertional angina pectoris prior to cardiac catheterization, since aggressive medical therapy including addition of beta-blocker, nitrate and amlodipine and starting her on Crestor 20 mg daily, she has not had any exertional chest pain but on questioning, she has not been walking but has only been doing exercises using her upper body.  I am very concerned about TIMI I-II flow that is noted in a very large LAD and there is a large area of distribution that is in jeopardy, I started her on Plavix 75 mg daily until decision is made.  Advised her to start walking on a daily basis to see if she has any exertional chest pain or dyspnea.  Advised her not to heavy exertional activity beyond initiation of chest pain.  I would like to see her back in 2 weeks for close monitoring.  Patient does have nitroglycerin.  I will also discuss her presentation during our team meeting.  -  clopidogrel (PLAVIX) 75 MG tablet; Take 1 tablet (75 mg total) by mouth daily.  Dispense: 90 tablet; Refill: 3 - isosorbide mononitrate (IMDUR) 60 MG 24 hr tablet; Take 0.5 tablets (30 mg total) by mouth daily.  Dispense: 30 tablet; Refill: 2  2. Essential hypertension Blood pressure is markedly elevated.  This is in spite of patient being on 3 antihypertensive medications.  I will start her on hydralazine 50 mg p.o. 3 times daily.  Physical examination remarkably is normal. - hydrALAZINE (APRESOLINE) 50 MG tablet; Take 1 tablet (50 mg total) by mouth 3 (three) times daily.  Dispense: 90 tablet; Refill: 2  3. Mixed hyperlipidemia She needs lipid profile testing, will also obtain Lp(a). - Lipid Panel With LDL/HDL Ratio - Lipoprotein A (LPA)  This was a complex presentation, renal review of her coronary angiogram with the patient, review of surgical consultation, review of medications and ordering of labs.   Yates Decamp, MD, [facility] 07/15/2022, 5:42 PM Office: [phone]

## 2022-07-16 ENCOUNTER — Encounter: Payer: Self-pay | Admitting: Cardiology

## 2022-07-16 ENCOUNTER — Telehealth: Payer: Self-pay

## 2022-07-16 NOTE — Telephone Encounter (Signed)
Patient says she walked 1.6 miles yesterday . She had no chest pain or sob.

## 2022-07-16 NOTE — Telephone Encounter (Signed)
Patient walked 1.6 miles yesterday with no angina, will continue to monitor

## 2022-07-20 ENCOUNTER — Emergency Department (HOSPITAL_COMMUNITY)
Admission: EM | Admit: 2022-07-20 | Discharge: 2022-07-20 | Disposition: A | Discharge status: Home / Self Care | Payer: Medicare Other | Attending: Emergency Medicine | Admitting: Emergency Medicine

## 2022-07-20 DIAGNOSIS — T1502XA Foreign body in cornea, left eye, initial encounter: Secondary | ICD-10-CM | POA: Diagnosis not present

## 2022-07-20 DIAGNOSIS — H02004 Unspecified entropion of left upper eyelid: Secondary | ICD-10-CM | POA: Insufficient documentation

## 2022-07-20 DIAGNOSIS — I10 Essential (primary) hypertension: Secondary | ICD-10-CM | POA: Diagnosis not present

## 2022-07-20 DIAGNOSIS — Z79899 Other long term (current) drug therapy: Secondary | ICD-10-CM | POA: Insufficient documentation

## 2022-07-20 DIAGNOSIS — Z7902 Long term (current) use of antithrombotics/antiplatelets: Secondary | ICD-10-CM | POA: Insufficient documentation

## 2022-07-20 DIAGNOSIS — Z7984 Long term (current) use of oral hypoglycemic drugs: Secondary | ICD-10-CM | POA: Diagnosis not present

## 2022-07-20 DIAGNOSIS — H578A2 Foreign body sensation, left eye: Secondary | ICD-10-CM

## 2022-07-20 DIAGNOSIS — H5712 Ocular pain, left eye: Secondary | ICD-10-CM

## 2022-07-20 MED ORDER — ERYTHROMYCIN 5 MG/GM OP OINT: TOPICAL_OINTMENT | OPHTHALMIC | 0 refills | Status: DC

## 2022-07-20 MED ORDER — FLUORESCEIN SODIUM 1 MG OP STRP
1.0000 | ORAL_STRIP | Freq: Once | OPHTHALMIC | Status: AC
  Administered 2022-07-20: 1 via OPHTHALMIC
  Filled 2022-07-20: qty 1

## 2022-07-20 MED ORDER — ERYTHROMYCIN 5 MG/GM OP OINT: TOPICAL_OINTMENT | OPHTHALMIC | 0 refills | Status: AC

## 2022-07-20 MED ORDER — TETRACAINE HCL 0.5 % OP SOLN
2.0000 [drp] | Freq: Once | OPHTHALMIC | Status: AC
  Administered 2022-07-20: 2 [drp] via OPHTHALMIC
  Filled 2022-07-20: qty 4

## 2022-07-20 NOTE — ED Triage Notes (Signed)
Pt arrived via POV. C/o pain in L eye that began at 3p yesterday. Pt states the pain is in their eyeball. No vision problems  Hx glaucoma, and eye surgery  [employer], independently mobile.

## 2022-07-20 NOTE — ED Provider Notes (Signed)
[place] EMERGENCY DEPARTMENT AT [facility] Provider Note   CSN: [id number] Arrival date & time: 07/20/22  7829     History  Chief Complaint  Patient presents with   Eye Pain    Tami Mercado is a 82 y.o. female with history of HTN, HLD, macular degeneration, open angle glaucoma in both eyes who presents to the ER complaining of left eye irritation starting last night.  Patient states that she was at the gym and was using a medicine ball, when she throat she is not sure if something came off and got into her eye.  She did not initially have problems at that time, but later that evening started having some discomfort in the left eye.  She feels as though something might have gotten it and caused irritation.  She denies any substantial pain.  No change in her vision.  She follows with an ophthalmologist with [facility].   Eye Pain       Home Medications Prior to Admission medications   Medication Sig Start Date End Date Taking? Authorizing Provider  amLODipine (NORVASC) 10 MG tablet Take 10 mg by mouth at bedtime.    [provider]  Apoaequorin (PREVAGEN PO) Take 1 tablet by mouth daily.    [provider]  aspirin (ASPIRIN CHILDRENS) 81 MG chewable tablet Chew 1 tablet (81 mg total) by mouth daily. 06/20/22   Yates Decamp, MD  brimonidine (ALPHAGAN P) 0.1 % SOLN Place 1 drop into the left eye in the morning and at bedtime.    [provider]  clopidogrel (PLAVIX) 75 MG tablet Take 1 tablet (75 mg total) by mouth daily. 07/15/22   Yates Decamp, MD  dorzolamide-timolol (COSOPT) 2-0.5 % ophthalmic solution Place 1 drop into both eyes 2 (two) times daily. 07/12/22   [provider]  erythromycin ophthalmic ointment Place a 1/2 inch ribbon of ointment into the lower eyelid. 07/20/22   Trenika Hudson T, PA-C  escitalopram (LEXAPRO) 5 MG tablet Take 2.5 mg by mouth daily. 08/26/21   [provider]  fenofibrate (TRICOR) 145 MG tablet  Take 145 mg by mouth daily.    [provider]  hydrALAZINE (APRESOLINE) 50 MG tablet Take 1 tablet (50 mg total) by mouth 3 (three) times daily. 07/15/22 10/13/22  Yates Decamp, MD  isosorbide mononitrate (IMDUR) 60 MG 24 hr tablet Take 0.5 tablets (30 mg total) by mouth daily. 07/15/22   Yates Decamp, MD  metFORMIN (GLUCOPHAGE) 500 MG tablet Take 500 mg by mouth daily. 11/22/19   [provider]  metoprolol succinate (TOPROL-XL) 50 MG 24 hr tablet Take 1 tablet (50 mg total) by mouth at bedtime. 06/20/22   Yates Decamp, MD  Multiple Vitamins-Minerals (PRESERVISION AREDS 2 PO) Take 2 capsules by mouth daily.    [provider]  nitroGLYCERIN (NITROSTAT) 0.4 MG SL tablet Place 1 tablet (0.4 mg total) under the tongue every 5 (five) minutes as needed for up to 25 days for chest pain. 05/23/22 07/15/22  Custovic, Rozell Searing, DO  pantoprazole (PROTONIX) 40 MG tablet Take 40 mg by mouth daily.    [provider]  rosuvastatin (CRESTOR) 20 MG tablet Take 1 tablet (20 mg total) by mouth at bedtime. 06/10/22 09/08/22  Custovic, Rozell Searing, DO  valsartan-hydrochlorothiazide (DIOVAN-HCT) 320-12.5 MG tablet Take 1 tablet by mouth daily. 07/12/21   [provider]      Allergies    Alendronate, Amoxicillin, Dicyclomine hcl, and Lisinopril    Review of Systems  Review of Systems  Eyes:  Positive for pain. Negative for photophobia and visual disturbance.  All other systems reviewed and are negative.   Physical Exam Updated Vital Signs BP (!) 170/71 (BP Location: Left Arm)   Pulse 62   Temp 97.6 F (36.4 C) (Oral)   Resp 18   Ht 5\' 2"  (1.575 m)   Wt 49 kg   SpO2 100%   BMI 19.75 kg/m  Physical Exam Vitals and nursing note reviewed.  Constitutional:      Appearance: Normal appearance.  HENT:     Head: Normocephalic and atraumatic.  Eyes:     General: Vision grossly intact.     Intraocular pressure: Left eye pressure is 15 mmHg. Measurements were taken using a handheld  tonometer.    Extraocular Movements: Extraocular movements intact.     Conjunctiva/sclera: Conjunctivae normal.     Pupils: Pupils are equal, round, and reactive to light.     Comments: Left upper eyelid mild entropion  Pulmonary:     Effort: Pulmonary effort is normal. No respiratory distress.  Skin:    General: Skin is warm and dry.  Neurological:     Mental Status: She is alert.  Psychiatric:        Mood and Affect: Mood normal.        Behavior: Behavior normal.     ED Results / Procedures / Treatments   Labs (all labs ordered are listed, but only abnormal results are displayed) Labs Reviewed - No data to display  EKG None  Radiology No results found.  Procedures Procedures    Medications Ordered in ED Medications  tetracaine (PONTOCAINE) 0.5 % ophthalmic solution 2 drop (2 drops Left Eye Given 07/20/22 0941)  fluorescein ophthalmic strip 1 strip (1 strip Left Eye Given 07/20/22 0941)    ED Course/ Medical Decision Making/ A&P                             Medical Decision Making Risk Prescription drug management.   This patient is a 82 y.o. female  who presents to the ED for concern of L eye irritation.   Differential diagnoses prior to evaluation: The emergent differential diagnosis includes, but is not limited to,  conjunctivitis, acute angle closure glaucoma, corneal abrasion/ulceration, orbital cellulitis. This is not an exhaustive differential.   Past Medical History / Co-morbidities: HTN, HLD, macular degeneration, open angle glaucoma  Additional history: Chart reviewed. Pertinent results include: Reviewed most recent ophthalmology visit from 5/11 in which patient had improving intraocular pressure, is currently on several different eyedrops. Follows with Dr Lottie Dawson with [facility].   Physical Exam: Physical exam performed. The pertinent findings include: Normal conjunctiva. Normal IOP at 15. No corneal abrasion or ulceration. PERRLA, EOMI. Mild left  upper eyelid entropion.   Medications: I ordered medication including tetracaine drops and fluorescein strip.  I have reviewed the patients home medicines and have made adjustments as needed.   Disposition: After consideration of the diagnostic results and the patients response to treatment, I feel that emergency department workup does not suggest an emergent condition requiring admission or immediate intervention beyond what has been performed at this time. The plan is: discharge to home with treatment of likely left conjunctival abrasion. No change in vision, normal IOP. Low concern for acute angle closure glaucoma, no evidence of infection.  Encouraged close follow up with ophthalmology. The patient is safe for discharge and has been instructed to  return immediately for worsening symptoms, change in symptoms or any other concerns.  Final Clinical Impression(s) / ED Diagnoses Final diagnoses:  Discomfort of left eye  Foreign body sensation, left eye    Rx / DC Orders ED Discharge Orders          Ordered    erythromycin ophthalmic ointment        07/20/22 1008           Portions of this report may have been transcribed using voice recognition software. Every effort was made to ensure accuracy; however, inadvertent computerized transcription errors may be present.    Jeanella Flattery 07/20/22 1020    Bethann Berkshire, MD 07/22/22 1650

## 2022-07-20 NOTE — Discharge Instructions (Addendum)
You are seen emergency department today for eye discomfort.  As we discussed the pressure of your eye was normal, which is most reassuring thing.  I think that you could have gotten something in the eye and with scratching it might have caused a minor abrasion of the white part of your eye.    I recommend artificial tears as needed for comfort, and also prescribing you an antibiotic ointment to prevent any infection.  Please use a small strip of this on your inner lower eyelid 4 times daily for the next week.  I have attached contact information for the eye doctor that we have on call.  I recommend giving their office call first thing tomorrow morning and requesting a follow-up appointment.  Continue to monitor how you're doing and return to the ER for new or worsening symptoms.

## 2022-07-29 ENCOUNTER — Ambulatory Visit: Payer: Medicare Other | Admitting: Cardiology

## 2022-07-30 DIAGNOSIS — I2510 Atherosclerotic heart disease of native coronary artery without angina pectoris: Secondary | ICD-10-CM | POA: Diagnosis not present

## 2022-07-30 DIAGNOSIS — I10 Essential (primary) hypertension: Secondary | ICD-10-CM | POA: Diagnosis not present

## 2022-08-11 DIAGNOSIS — H401133 Primary open-angle glaucoma, bilateral, severe stage: Secondary | ICD-10-CM | POA: Diagnosis not present

## 2022-08-12 ENCOUNTER — Ambulatory Visit
Admission: RE | Admit: 2022-08-12 | Discharge: 2022-08-12 | Disposition: A | Discharge status: Home / Self Care | Payer: Medicare Other | Source: Ambulatory Visit | Attending: Internal Medicine | Admitting: Internal Medicine

## 2022-08-12 DIAGNOSIS — M810 Age-related osteoporosis without current pathological fracture: Secondary | ICD-10-CM

## 2022-08-12 DIAGNOSIS — Z1231 Encounter for screening mammogram for malignant neoplasm of breast: Secondary | ICD-10-CM

## 2022-08-12 DIAGNOSIS — R636 Underweight: Secondary | ICD-10-CM | POA: Diagnosis not present

## 2022-08-12 DIAGNOSIS — E349 Endocrine disorder, unspecified: Secondary | ICD-10-CM | POA: Diagnosis not present

## 2022-08-26 DIAGNOSIS — H353124 Nonexudative age-related macular degeneration, left eye, advanced atrophic with subfoveal involvement: Secondary | ICD-10-CM | POA: Diagnosis not present

## 2022-08-26 DIAGNOSIS — H353113 Nonexudative age-related macular degeneration, right eye, advanced atrophic without subfoveal involvement: Secondary | ICD-10-CM | POA: Diagnosis not present

## 2022-08-26 DIAGNOSIS — H348121 Central retinal vein occlusion, left eye, with retinal neovascularization: Secondary | ICD-10-CM | POA: Diagnosis not present

## 2022-08-26 DIAGNOSIS — H348122 Central retinal vein occlusion, left eye, stable: Secondary | ICD-10-CM | POA: Diagnosis not present

## 2022-08-26 DIAGNOSIS — H401123 Primary open-angle glaucoma, left eye, severe stage: Secondary | ICD-10-CM | POA: Diagnosis not present

## 2022-08-26 DIAGNOSIS — H353212 Exudative age-related macular degeneration, right eye, with inactive choroidal neovascularization: Secondary | ICD-10-CM | POA: Diagnosis not present

## 2022-09-24 ENCOUNTER — Other Ambulatory Visit: Payer: Self-pay

## 2022-10-10 ENCOUNTER — Other Ambulatory Visit: Payer: Self-pay | Admitting: Cardiology

## 2022-10-10 DIAGNOSIS — I10 Essential (primary) hypertension: Secondary | ICD-10-CM

## 2022-10-13 DIAGNOSIS — H16223 Keratoconjunctivitis sicca, not specified as Sjogren's, bilateral: Secondary | ICD-10-CM | POA: Diagnosis not present

## 2022-10-13 DIAGNOSIS — H353124 Nonexudative age-related macular degeneration, left eye, advanced atrophic with subfoveal involvement: Secondary | ICD-10-CM | POA: Diagnosis not present

## 2022-10-13 DIAGNOSIS — H04123 Dry eye syndrome of bilateral lacrimal glands: Secondary | ICD-10-CM | POA: Diagnosis not present

## 2022-10-13 DIAGNOSIS — H524 Presbyopia: Secondary | ICD-10-CM | POA: Diagnosis not present

## 2022-10-13 DIAGNOSIS — H353113 Nonexudative age-related macular degeneration, right eye, advanced atrophic without subfoveal involvement: Secondary | ICD-10-CM | POA: Diagnosis not present

## 2022-10-16 DIAGNOSIS — H401123 Primary open-angle glaucoma, left eye, severe stage: Secondary | ICD-10-CM | POA: Diagnosis not present

## 2022-10-16 DIAGNOSIS — H348122 Central retinal vein occlusion, left eye, stable: Secondary | ICD-10-CM | POA: Diagnosis not present

## 2022-10-16 DIAGNOSIS — H353212 Exudative age-related macular degeneration, right eye, with inactive choroidal neovascularization: Secondary | ICD-10-CM | POA: Diagnosis not present

## 2022-10-16 DIAGNOSIS — H353113 Nonexudative age-related macular degeneration, right eye, advanced atrophic without subfoveal involvement: Secondary | ICD-10-CM | POA: Diagnosis not present

## 2022-10-16 DIAGNOSIS — H353124 Nonexudative age-related macular degeneration, left eye, advanced atrophic with subfoveal involvement: Secondary | ICD-10-CM | POA: Diagnosis not present

## 2022-10-16 DIAGNOSIS — H348121 Central retinal vein occlusion, left eye, with retinal neovascularization: Secondary | ICD-10-CM | POA: Diagnosis not present

## 2022-10-21 DIAGNOSIS — H353212 Exudative age-related macular degeneration, right eye, with inactive choroidal neovascularization: Secondary | ICD-10-CM | POA: Diagnosis not present

## 2022-10-21 DIAGNOSIS — H348122 Central retinal vein occlusion, left eye, stable: Secondary | ICD-10-CM | POA: Diagnosis not present

## 2022-10-21 DIAGNOSIS — H348121 Central retinal vein occlusion, left eye, with retinal neovascularization: Secondary | ICD-10-CM | POA: Diagnosis not present

## 2022-10-21 DIAGNOSIS — H401123 Primary open-angle glaucoma, left eye, severe stage: Secondary | ICD-10-CM | POA: Diagnosis not present

## 2022-10-21 DIAGNOSIS — H353113 Nonexudative age-related macular degeneration, right eye, advanced atrophic without subfoveal involvement: Secondary | ICD-10-CM | POA: Diagnosis not present

## 2022-10-21 DIAGNOSIS — H353124 Nonexudative age-related macular degeneration, left eye, advanced atrophic with subfoveal involvement: Secondary | ICD-10-CM | POA: Diagnosis not present

## 2022-10-22 ENCOUNTER — Other Ambulatory Visit: Payer: Self-pay

## 2022-10-22 ENCOUNTER — Emergency Department (HOSPITAL_COMMUNITY)
Admission: EM | Admit: 2022-10-22 | Discharge: 2022-10-22 | Disposition: A | Discharge status: Home / Self Care | Payer: Medicare Other | Attending: Emergency Medicine | Admitting: Emergency Medicine

## 2022-10-22 ENCOUNTER — Encounter (HOSPITAL_COMMUNITY): Payer: Self-pay

## 2022-10-22 DIAGNOSIS — R059 Cough, unspecified: Secondary | ICD-10-CM | POA: Diagnosis present

## 2022-10-22 DIAGNOSIS — I2510 Atherosclerotic heart disease of native coronary artery without angina pectoris: Secondary | ICD-10-CM | POA: Diagnosis not present

## 2022-10-22 DIAGNOSIS — Z20822 Contact with and (suspected) exposure to covid-19: Secondary | ICD-10-CM | POA: Diagnosis not present

## 2022-10-22 DIAGNOSIS — B349 Viral infection, unspecified: Secondary | ICD-10-CM | POA: Diagnosis not present

## 2022-10-22 DIAGNOSIS — I10 Essential (primary) hypertension: Secondary | ICD-10-CM | POA: Insufficient documentation

## 2022-10-22 DIAGNOSIS — Z7902 Long term (current) use of antithrombotics/antiplatelets: Secondary | ICD-10-CM | POA: Insufficient documentation

## 2022-10-22 DIAGNOSIS — Z79899 Other long term (current) drug therapy: Secondary | ICD-10-CM | POA: Diagnosis not present

## 2022-10-22 DIAGNOSIS — R42 Dizziness and giddiness: Secondary | ICD-10-CM | POA: Diagnosis not present

## 2022-10-22 LAB — SARS CORONAVIRUS 2 BY RT PCR: SARS Coronavirus 2 by RT PCR: NEGATIVE

## 2022-10-22 NOTE — ED Provider Notes (Signed)
[place] EMERGENCY DEPARTMENT AT [facility] Provider Note   CSN: [id number] Arrival date & time: 10/22/22  2055     History  Chief Complaint  Patient presents with   Generalized Body Aches   Dizziness    Tami Mercado is a 82 y.o. female.  Patient presents to the emergency department complaining of bodyaches, runny nose, dry cough which began today.  She denies abdominal pain, nausea, vomiting, shortness of breath, chest pain, headache, fever.  She states she took 1 Tylenol at home which minimally helped with her symptoms.  Past medical history significant for hypertension, hyperlipidemia, right-sided breast mass, coronary artery disease  HPI     Home Medications Prior to Admission medications   Medication Sig Start Date End Date Taking? Authorizing Provider  amLODipine (NORVASC) 10 MG tablet Take 10 mg by mouth at bedtime.    [provider]  Apoaequorin (PREVAGEN PO) Take 1 tablet by mouth daily.    [provider]  aspirin (ASPIRIN CHILDRENS) 81 MG chewable tablet Chew 1 tablet (81 mg total) by mouth daily. 06/20/22   Yates Decamp, MD  brimonidine (ALPHAGAN P) 0.1 % SOLN Place 1 drop into the left eye in the morning and at bedtime.    [provider]  clopidogrel (PLAVIX) 75 MG tablet Take 1 tablet (75 mg total) by mouth daily. 07/15/22   Yates Decamp, MD  dorzolamide-timolol (COSOPT) 2-0.5 % ophthalmic solution Place 1 drop into both eyes 2 (two) times daily. 07/12/22   [provider]  erythromycin ophthalmic ointment Place a 1/2 inch ribbon of ointment into the lower eyelid. 07/20/22   Roemhildt, Lorin T, PA-C  escitalopram (LEXAPRO) 5 MG tablet Take 2.5 mg by mouth daily. 08/26/21   [provider]  fenofibrate (TRICOR) 145 MG tablet Take 145 mg by mouth daily.    [provider]  hydrALAZINE (APRESOLINE) 50 MG tablet TAKE 1 TABLET BY MOUTH THREE TIMES A DAY 10/10/22   Yates Decamp, MD  isosorbide mononitrate (IMDUR)  60 MG 24 hr tablet Take 0.5 tablets (30 mg total) by mouth daily. 07/15/22   Yates Decamp, MD  metFORMIN (GLUCOPHAGE) 500 MG tablet Take 500 mg by mouth daily. 11/22/19   [provider]  metoprolol succinate (TOPROL-XL) 50 MG 24 hr tablet Take 1 tablet (50 mg total) by mouth at bedtime. 06/20/22   Yates Decamp, MD  Multiple Vitamins-Minerals (PRESERVISION AREDS 2 PO) Take 2 capsules by mouth daily.    [provider]  nitroGLYCERIN (NITROSTAT) 0.4 MG SL tablet Place 1 tablet (0.4 mg total) under the tongue every 5 (five) minutes as needed for up to 25 days for chest pain. 05/23/22 07/15/22  Custovic, Rozell Searing, DO  pantoprazole (PROTONIX) 40 MG tablet Take 40 mg by mouth daily.    [provider]  rosuvastatin (CRESTOR) 20 MG tablet Take 1 tablet (20 mg total) by mouth at bedtime. 06/10/22 09/08/22  Custovic, Rozell Searing, DO  valsartan-hydrochlorothiazide (DIOVAN-HCT) 320-12.5 MG tablet Take 1 tablet by mouth daily. 07/12/21   [provider]      Allergies    Alendronate, Amoxicillin, Dicyclomine hcl, and Lisinopril    Review of Systems   Review of Systems  Physical Exam Updated Vital Signs BP (!) 158/69   Pulse 73   Temp 97.7 F (36.5 C) (Oral)   Resp 18   Ht 5\' 2"  (1.575 m)   Wt 49 kg   SpO2 97%   BMI 19.75 kg/m  Physical Exam Vitals and  nursing note reviewed.  HENT:     Head: Normocephalic and atraumatic.  Eyes:     Conjunctiva/sclera: Conjunctivae normal.  Cardiovascular:     Rate and Rhythm: Normal rate and regular rhythm.  Pulmonary:     Effort: Pulmonary effort is normal. No respiratory distress.     Breath sounds: Normal breath sounds.  Abdominal:     Palpations: Abdomen is soft.     Tenderness: There is no abdominal tenderness.  Musculoskeletal:        General: No signs of injury.     Cervical back: Normal range of motion.  Skin:    General: Skin is warm and dry.  Neurological:     Mental Status: She is alert.  Psychiatric:        Speech:  Speech normal.        Behavior: Behavior normal.     ED Results / Procedures / Treatments   Labs (all labs ordered are listed, but only abnormal results are displayed) Labs Reviewed  SARS CORONAVIRUS 2 BY RT PCR    EKG EKG Interpretation Date/Time:  Wednesday October 22 2022 21:13:43 EDT Ventricular Rate:  65 PR Interval:  143 QRS Duration:  87 QT Interval:  418 QTC Calculation: 435 R Axis:   80  Text Interpretation: Sinus rhythm Confirmed by Tilden Fossa [phone]) on 10/22/2022 11:02:01 PM  Radiology No results found.  Procedures Procedures    Medications Ordered in ED Medications - No data to display  ED Course/ Medical Decision Making/ A&P                                 Medical Decision Making  This patient presents to the ED for concern of respiratory symptoms, this involves an extensive number of treatment options, and is a complaint that carries with it a high risk of complications and morbidity.  The differential diagnosis includes viral infection, COVID-19, influenza, RSV, others, pneumonia, bacterial infections, others   Co morbidities that complicate the patient evaluation  Hypertension   Lab Tests:  I Ordered, and personally interpreted labs.  The pertinent results include:  Negative COVID test   Imaging Studies ordered:  I considered a chest x-ray but the patient has no shortness of breath and lungs are clear to auscultation bilaterally   Cardiac Monitoring: / EKG:  The patient was maintained on a cardiac monitor.  I personally viewed and interpreted the cardiac monitored which showed an underlying rhythm of: Sinus rhythm   Test / Admission - Considered:  Patient with symptoms consistent with a viral respiratory illness.  Vitals are normal.  No shortness of breath.  No signs of pneumonia.  Lungs are clear to auscultation bilaterally.  Patient is afebrile.  Plan to discharge home with recommendations for supportive care at home including  Tylenol usage.  Strict return precautions including shortness of breath, chest pain, provided.  Patient plans to follow-up with her primary care provider.         Final Clinical Impression(s) / ED Diagnoses Final diagnoses:  Viral illness    Rx / DC Orders ED Discharge Orders     None         Pamala Duffel 10/22/22 2313    Tilden Fossa, MD 10/23/22 2250

## 2022-10-22 NOTE — Discharge Instructions (Signed)
You were evaluated today for symptoms which are consistent with an upper respiratory infection.  Please take Tylenol at home for symptom relief.  If you develop any life-threatening symptoms please return to the emergency department, otherwise follow-up with your primary care provider.

## 2022-10-22 NOTE — ED Triage Notes (Signed)
Body aches, runny nose, dry cough, and dizziness that started today. Denies pain or sob

## 2022-10-29 DIAGNOSIS — I10 Essential (primary) hypertension: Secondary | ICD-10-CM | POA: Diagnosis not present

## 2022-10-29 DIAGNOSIS — B349 Viral infection, unspecified: Secondary | ICD-10-CM | POA: Diagnosis not present

## 2022-11-27 DIAGNOSIS — H401133 Primary open-angle glaucoma, bilateral, severe stage: Secondary | ICD-10-CM | POA: Diagnosis not present

## 2022-12-02 DIAGNOSIS — H401123 Primary open-angle glaucoma, left eye, severe stage: Secondary | ICD-10-CM | POA: Diagnosis not present

## 2022-12-02 DIAGNOSIS — H353212 Exudative age-related macular degeneration, right eye, with inactive choroidal neovascularization: Secondary | ICD-10-CM | POA: Diagnosis not present

## 2022-12-02 DIAGNOSIS — H348121 Central retinal vein occlusion, left eye, with retinal neovascularization: Secondary | ICD-10-CM | POA: Diagnosis not present

## 2022-12-02 DIAGNOSIS — H348122 Central retinal vein occlusion, left eye, stable: Secondary | ICD-10-CM | POA: Diagnosis not present

## 2022-12-02 DIAGNOSIS — H353124 Nonexudative age-related macular degeneration, left eye, advanced atrophic with subfoveal involvement: Secondary | ICD-10-CM | POA: Diagnosis not present

## 2022-12-02 DIAGNOSIS — H353113 Nonexudative age-related macular degeneration, right eye, advanced atrophic without subfoveal involvement: Secondary | ICD-10-CM | POA: Diagnosis not present

## 2022-12-11 DIAGNOSIS — H348122 Central retinal vein occlusion, left eye, stable: Secondary | ICD-10-CM | POA: Diagnosis not present

## 2022-12-11 DIAGNOSIS — H5712 Ocular pain, left eye: Secondary | ICD-10-CM | POA: Diagnosis not present

## 2022-12-11 DIAGNOSIS — H348121 Central retinal vein occlusion, left eye, with retinal neovascularization: Secondary | ICD-10-CM | POA: Diagnosis not present

## 2022-12-11 DIAGNOSIS — H401123 Primary open-angle glaucoma, left eye, severe stage: Secondary | ICD-10-CM | POA: Diagnosis not present

## 2022-12-11 DIAGNOSIS — H353113 Nonexudative age-related macular degeneration, right eye, advanced atrophic without subfoveal involvement: Secondary | ICD-10-CM | POA: Diagnosis not present

## 2022-12-11 DIAGNOSIS — H353124 Nonexudative age-related macular degeneration, left eye, advanced atrophic with subfoveal involvement: Secondary | ICD-10-CM | POA: Diagnosis not present

## 2022-12-11 DIAGNOSIS — H353212 Exudative age-related macular degeneration, right eye, with inactive choroidal neovascularization: Secondary | ICD-10-CM | POA: Diagnosis not present

## 2023-01-08 ENCOUNTER — Other Ambulatory Visit: Payer: Self-pay

## 2023-01-08 DIAGNOSIS — I25118 Atherosclerotic heart disease of native coronary artery with other forms of angina pectoris: Secondary | ICD-10-CM

## 2023-01-08 MED ORDER — ISOSORBIDE MONONITRATE ER 60 MG PO TB24: 30.0000 mg | ORAL_TABLET | Freq: Every day | ORAL | 6 refills | Status: DC

## 2023-01-13 DIAGNOSIS — H348122 Central retinal vein occlusion, left eye, stable: Secondary | ICD-10-CM | POA: Diagnosis not present

## 2023-01-13 DIAGNOSIS — H353124 Nonexudative age-related macular degeneration, left eye, advanced atrophic with subfoveal involvement: Secondary | ICD-10-CM | POA: Diagnosis not present

## 2023-01-13 DIAGNOSIS — H353212 Exudative age-related macular degeneration, right eye, with inactive choroidal neovascularization: Secondary | ICD-10-CM | POA: Diagnosis not present

## 2023-01-13 DIAGNOSIS — H348121 Central retinal vein occlusion, left eye, with retinal neovascularization: Secondary | ICD-10-CM | POA: Diagnosis not present

## 2023-01-13 DIAGNOSIS — H5712 Ocular pain, left eye: Secondary | ICD-10-CM | POA: Diagnosis not present

## 2023-01-13 DIAGNOSIS — H401123 Primary open-angle glaucoma, left eye, severe stage: Secondary | ICD-10-CM | POA: Diagnosis not present

## 2023-01-13 DIAGNOSIS — H353113 Nonexudative age-related macular degeneration, right eye, advanced atrophic without subfoveal involvement: Secondary | ICD-10-CM | POA: Diagnosis not present

## 2023-01-19 DIAGNOSIS — H401133 Primary open-angle glaucoma, bilateral, severe stage: Secondary | ICD-10-CM | POA: Diagnosis not present

## 2023-02-05 DIAGNOSIS — I272 Pulmonary hypertension, unspecified: Secondary | ICD-10-CM | POA: Diagnosis not present

## 2023-02-05 DIAGNOSIS — M810 Age-related osteoporosis without current pathological fracture: Secondary | ICD-10-CM | POA: Diagnosis not present

## 2023-02-05 DIAGNOSIS — Z853 Personal history of malignant neoplasm of breast: Secondary | ICD-10-CM | POA: Diagnosis not present

## 2023-02-05 DIAGNOSIS — E782 Mixed hyperlipidemia: Secondary | ICD-10-CM | POA: Diagnosis not present

## 2023-02-05 DIAGNOSIS — K5900 Constipation, unspecified: Secondary | ICD-10-CM | POA: Diagnosis not present

## 2023-02-05 DIAGNOSIS — N182 Chronic kidney disease, stage 2 (mild): Secondary | ICD-10-CM | POA: Diagnosis not present

## 2023-02-05 DIAGNOSIS — Z0000 Encounter for general adult medical examination without abnormal findings: Secondary | ICD-10-CM | POA: Diagnosis not present

## 2023-02-05 DIAGNOSIS — I25118 Atherosclerotic heart disease of native coronary artery with other forms of angina pectoris: Secondary | ICD-10-CM | POA: Diagnosis not present

## 2023-02-05 DIAGNOSIS — I10 Essential (primary) hypertension: Secondary | ICD-10-CM | POA: Diagnosis not present

## 2023-02-05 DIAGNOSIS — H353212 Exudative age-related macular degeneration, right eye, with inactive choroidal neovascularization: Secondary | ICD-10-CM | POA: Diagnosis not present

## 2023-02-05 DIAGNOSIS — E1151 Type 2 diabetes mellitus with diabetic peripheral angiopathy without gangrene: Secondary | ICD-10-CM | POA: Diagnosis not present

## 2023-03-09 DIAGNOSIS — E782 Mixed hyperlipidemia: Secondary | ICD-10-CM | POA: Diagnosis not present

## 2023-03-16 DIAGNOSIS — E782 Mixed hyperlipidemia: Secondary | ICD-10-CM | POA: Diagnosis not present

## 2023-03-19 DIAGNOSIS — H348122 Central retinal vein occlusion, left eye, stable: Secondary | ICD-10-CM | POA: Diagnosis not present

## 2023-03-19 DIAGNOSIS — H5712 Ocular pain, left eye: Secondary | ICD-10-CM | POA: Diagnosis not present

## 2023-03-19 DIAGNOSIS — H353124 Nonexudative age-related macular degeneration, left eye, advanced atrophic with subfoveal involvement: Secondary | ICD-10-CM | POA: Diagnosis not present

## 2023-03-19 DIAGNOSIS — H353113 Nonexudative age-related macular degeneration, right eye, advanced atrophic without subfoveal involvement: Secondary | ICD-10-CM | POA: Diagnosis not present

## 2023-03-19 DIAGNOSIS — H401123 Primary open-angle glaucoma, left eye, severe stage: Secondary | ICD-10-CM | POA: Diagnosis not present

## 2023-03-19 DIAGNOSIS — H353212 Exudative age-related macular degeneration, right eye, with inactive choroidal neovascularization: Secondary | ICD-10-CM | POA: Diagnosis not present

## 2023-03-19 DIAGNOSIS — H348121 Central retinal vein occlusion, left eye, with retinal neovascularization: Secondary | ICD-10-CM | POA: Diagnosis not present

## 2023-03-30 ENCOUNTER — Encounter (HOSPITAL_COMMUNITY): Payer: Self-pay

## 2023-03-30 ENCOUNTER — Other Ambulatory Visit: Payer: Self-pay

## 2023-03-30 ENCOUNTER — Emergency Department (HOSPITAL_COMMUNITY)
Admission: EM | Admit: 2023-03-30 | Discharge: 2023-03-30 | Disposition: A | Discharge status: Home / Self Care | Payer: No Typology Code available for payment source | Attending: Emergency Medicine | Admitting: Emergency Medicine

## 2023-03-30 ENCOUNTER — Emergency Department (HOSPITAL_COMMUNITY): Payer: No Typology Code available for payment source

## 2023-03-30 DIAGNOSIS — R0789 Other chest pain: Secondary | ICD-10-CM | POA: Diagnosis not present

## 2023-03-30 DIAGNOSIS — Y92410 Unspecified street and highway as the place of occurrence of the external cause: Secondary | ICD-10-CM | POA: Insufficient documentation

## 2023-03-30 DIAGNOSIS — R079 Chest pain, unspecified: Secondary | ICD-10-CM | POA: Insufficient documentation

## 2023-03-30 DIAGNOSIS — Z7982 Long term (current) use of aspirin: Secondary | ICD-10-CM | POA: Diagnosis not present

## 2023-03-30 DIAGNOSIS — Z79899 Other long term (current) drug therapy: Secondary | ICD-10-CM | POA: Insufficient documentation

## 2023-03-30 DIAGNOSIS — I10 Essential (primary) hypertension: Secondary | ICD-10-CM | POA: Diagnosis not present

## 2023-03-30 MED ORDER — ACETAMINOPHEN 500 MG PO TABS
500.0000 mg | ORAL_TABLET | Freq: Four times a day (QID) | ORAL | 0 refills | Status: AC | PRN
Start: 2023-03-30 — End: ?

## 2023-03-30 MED ORDER — OXYCODONE-ACETAMINOPHEN 5-325 MG PO TABS
1.0000 | ORAL_TABLET | ORAL | Status: DC | PRN
  Administered 2023-03-30: 1 via ORAL
  Filled 2023-03-30: qty 1

## 2023-03-30 MED ORDER — CYCLOBENZAPRINE HCL 10 MG PO TABS: 10.0000 mg | ORAL_TABLET | Freq: Two times a day (BID) | ORAL | 0 refills | Status: AC | PRN

## 2023-03-30 NOTE — Discharge Instructions (Addendum)
You have been evaluated for your symptoms.  Fortunately x-ray did not show any broken bones or significant injury.  You may take Tylenol and muscle relaxant as needed for aches and pain.  Avoid taking any anti-inflammatory medication while taking Plavix as it may increase risk of bleeding.  The blood pressure is elevated today, please have it rechecked by your primary care provider and make sure to take your blood pressure medication regularly.
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
## 2023-04-01 DIAGNOSIS — R053 Chronic cough: Secondary | ICD-10-CM | POA: Diagnosis not present

## 2023-04-01 DIAGNOSIS — R0789 Other chest pain: Secondary | ICD-10-CM | POA: Diagnosis not present

## 2023-04-01 DIAGNOSIS — R5383 Other fatigue: Secondary | ICD-10-CM | POA: Diagnosis not present

## 2023-04-17 ENCOUNTER — Ambulatory Visit: Payer: Medicare Other | Admitting: Cardiology

## 2023-04-30 DIAGNOSIS — H353212 Exudative age-related macular degeneration, right eye, with inactive choroidal neovascularization: Secondary | ICD-10-CM | POA: Diagnosis not present

## 2023-04-30 DIAGNOSIS — H348122 Central retinal vein occlusion, left eye, stable: Secondary | ICD-10-CM | POA: Diagnosis not present

## 2023-04-30 DIAGNOSIS — H401123 Primary open-angle glaucoma, left eye, severe stage: Secondary | ICD-10-CM | POA: Diagnosis not present

## 2023-04-30 DIAGNOSIS — H348121 Central retinal vein occlusion, left eye, with retinal neovascularization: Secondary | ICD-10-CM | POA: Diagnosis not present

## 2023-04-30 DIAGNOSIS — H353124 Nonexudative age-related macular degeneration, left eye, advanced atrophic with subfoveal involvement: Secondary | ICD-10-CM | POA: Diagnosis not present

## 2023-04-30 DIAGNOSIS — H353113 Nonexudative age-related macular degeneration, right eye, advanced atrophic without subfoveal involvement: Secondary | ICD-10-CM | POA: Diagnosis not present

## 2023-04-30 DIAGNOSIS — H5712 Ocular pain, left eye: Secondary | ICD-10-CM | POA: Diagnosis not present

## 2023-06-15 DIAGNOSIS — H401133 Primary open-angle glaucoma, bilateral, severe stage: Secondary | ICD-10-CM | POA: Diagnosis not present

## 2023-06-16 DIAGNOSIS — H348121 Central retinal vein occlusion, left eye, with retinal neovascularization: Secondary | ICD-10-CM | POA: Diagnosis not present

## 2023-06-16 DIAGNOSIS — H353113 Nonexudative age-related macular degeneration, right eye, advanced atrophic without subfoveal involvement: Secondary | ICD-10-CM | POA: Diagnosis not present

## 2023-06-16 DIAGNOSIS — H353212 Exudative age-related macular degeneration, right eye, with inactive choroidal neovascularization: Secondary | ICD-10-CM | POA: Diagnosis not present

## 2023-06-16 DIAGNOSIS — H348122 Central retinal vein occlusion, left eye, stable: Secondary | ICD-10-CM | POA: Diagnosis not present

## 2023-06-16 DIAGNOSIS — H401123 Primary open-angle glaucoma, left eye, severe stage: Secondary | ICD-10-CM | POA: Diagnosis not present

## 2023-06-16 DIAGNOSIS — H353124 Nonexudative age-related macular degeneration, left eye, advanced atrophic with subfoveal involvement: Secondary | ICD-10-CM | POA: Diagnosis not present

## 2023-06-16 DIAGNOSIS — H5712 Ocular pain, left eye: Secondary | ICD-10-CM | POA: Diagnosis not present

## 2023-07-01 DIAGNOSIS — H401111 Primary open-angle glaucoma, right eye, mild stage: Secondary | ICD-10-CM | POA: Diagnosis not present

## 2023-07-01 DIAGNOSIS — H53432 Sector or arcuate defects, left eye: Secondary | ICD-10-CM | POA: Diagnosis not present

## 2023-07-01 DIAGNOSIS — H401123 Primary open-angle glaucoma, left eye, severe stage: Secondary | ICD-10-CM | POA: Diagnosis not present

## 2023-07-01 DIAGNOSIS — H5709 Other anomalies of pupillary function: Secondary | ICD-10-CM | POA: Diagnosis not present

## 2023-07-10 ENCOUNTER — Other Ambulatory Visit: Payer: Self-pay

## 2023-07-10 DIAGNOSIS — I25118 Atherosclerotic heart disease of native coronary artery with other forms of angina pectoris: Secondary | ICD-10-CM

## 2023-07-10 MED ORDER — CLOPIDOGREL BISULFATE 75 MG PO TABS
75.0000 mg | ORAL_TABLET | Freq: Every day | ORAL | 0 refills | Status: AC
Start: 2023-07-10 — End: ?

## 2023-07-14 ENCOUNTER — Other Ambulatory Visit: Payer: Self-pay | Admitting: Cardiology

## 2023-07-14 DIAGNOSIS — I10 Essential (primary) hypertension: Secondary | ICD-10-CM

## 2023-07-23 DIAGNOSIS — H401133 Primary open-angle glaucoma, bilateral, severe stage: Secondary | ICD-10-CM | POA: Diagnosis not present

## 2023-08-04 DIAGNOSIS — M810 Age-related osteoporosis without current pathological fracture: Secondary | ICD-10-CM | POA: Diagnosis not present

## 2023-08-04 DIAGNOSIS — I10 Essential (primary) hypertension: Secondary | ICD-10-CM | POA: Diagnosis not present

## 2023-08-04 DIAGNOSIS — I25118 Atherosclerotic heart disease of native coronary artery with other forms of angina pectoris: Secondary | ICD-10-CM | POA: Diagnosis not present

## 2023-08-04 DIAGNOSIS — E1122 Type 2 diabetes mellitus with diabetic chronic kidney disease: Secondary | ICD-10-CM | POA: Diagnosis not present

## 2023-08-04 DIAGNOSIS — E782 Mixed hyperlipidemia: Secondary | ICD-10-CM | POA: Diagnosis not present

## 2023-08-04 DIAGNOSIS — E46 Unspecified protein-calorie malnutrition: Secondary | ICD-10-CM | POA: Diagnosis not present

## 2023-08-04 DIAGNOSIS — Z853 Personal history of malignant neoplasm of breast: Secondary | ICD-10-CM | POA: Diagnosis not present

## 2023-08-04 DIAGNOSIS — H353210 Exudative age-related macular degeneration, right eye, stage unspecified: Secondary | ICD-10-CM | POA: Diagnosis not present

## 2023-08-04 DIAGNOSIS — I272 Pulmonary hypertension, unspecified: Secondary | ICD-10-CM | POA: Diagnosis not present

## 2023-08-05 ENCOUNTER — Telehealth: Payer: Self-pay | Admitting: Cardiology

## 2023-08-05 NOTE — Telephone Encounter (Signed)
 Yes

## 2023-08-05 NOTE — Telephone Encounter (Signed)
 Received a referral that patient is wanting to do a provider switch to Dr. Emmette Harms. Are both of you okay with this?

## 2023-08-06 DIAGNOSIS — H348122 Central retinal vein occlusion, left eye, stable: Secondary | ICD-10-CM | POA: Diagnosis not present

## 2023-08-06 DIAGNOSIS — H353212 Exudative age-related macular degeneration, right eye, with inactive choroidal neovascularization: Secondary | ICD-10-CM | POA: Diagnosis not present

## 2023-08-06 DIAGNOSIS — H401123 Primary open-angle glaucoma, left eye, severe stage: Secondary | ICD-10-CM | POA: Diagnosis not present

## 2023-08-06 DIAGNOSIS — H353113 Nonexudative age-related macular degeneration, right eye, advanced atrophic without subfoveal involvement: Secondary | ICD-10-CM | POA: Diagnosis not present

## 2023-08-06 DIAGNOSIS — H348121 Central retinal vein occlusion, left eye, with retinal neovascularization: Secondary | ICD-10-CM | POA: Diagnosis not present

## 2023-08-06 DIAGNOSIS — H353124 Nonexudative age-related macular degeneration, left eye, advanced atrophic with subfoveal involvement: Secondary | ICD-10-CM | POA: Diagnosis not present

## 2023-08-06 DIAGNOSIS — H5712 Ocular pain, left eye: Secondary | ICD-10-CM | POA: Diagnosis not present

## 2023-08-07 NOTE — Telephone Encounter (Signed)
 Lvm informing pt of provider switch approval. Requested cb to schedule referral as an OV with Dr. Emmette Harms.

## 2023-08-11 ENCOUNTER — Other Ambulatory Visit: Payer: Self-pay | Admitting: *Deleted

## 2023-08-11 DIAGNOSIS — I10 Essential (primary) hypertension: Secondary | ICD-10-CM

## 2023-08-11 MED ORDER — HYDRALAZINE HCL 50 MG PO TABS: 50.0000 mg | ORAL_TABLET | Freq: Three times a day (TID) | ORAL | 0 refills | Status: DC

## 2023-08-12 ENCOUNTER — Other Ambulatory Visit: Payer: Self-pay

## 2023-08-12 DIAGNOSIS — I10 Essential (primary) hypertension: Secondary | ICD-10-CM

## 2023-08-12 MED ORDER — HYDRALAZINE HCL 50 MG PO TABS
50.0000 mg | ORAL_TABLET | Freq: Three times a day (TID) | ORAL | 0 refills | Status: DC
Start: 2023-08-12 — End: 2023-11-23

## 2023-08-31 DIAGNOSIS — H401113 Primary open-angle glaucoma, right eye, severe stage: Secondary | ICD-10-CM | POA: Diagnosis not present

## 2023-08-31 DIAGNOSIS — Z853 Personal history of malignant neoplasm of breast: Secondary | ICD-10-CM | POA: Diagnosis not present

## 2023-08-31 DIAGNOSIS — E1151 Type 2 diabetes mellitus with diabetic peripheral angiopathy without gangrene: Secondary | ICD-10-CM | POA: Diagnosis not present

## 2023-08-31 DIAGNOSIS — H401123 Primary open-angle glaucoma, left eye, severe stage: Secondary | ICD-10-CM | POA: Diagnosis not present

## 2023-08-31 DIAGNOSIS — N182 Chronic kidney disease, stage 2 (mild): Secondary | ICD-10-CM | POA: Diagnosis not present

## 2023-09-17 DIAGNOSIS — H348122 Central retinal vein occlusion, left eye, stable: Secondary | ICD-10-CM | POA: Diagnosis not present

## 2023-09-17 DIAGNOSIS — H5712 Ocular pain, left eye: Secondary | ICD-10-CM | POA: Diagnosis not present

## 2023-09-17 DIAGNOSIS — H348121 Central retinal vein occlusion, left eye, with retinal neovascularization: Secondary | ICD-10-CM | POA: Diagnosis not present

## 2023-09-17 DIAGNOSIS — H353124 Nonexudative age-related macular degeneration, left eye, advanced atrophic with subfoveal involvement: Secondary | ICD-10-CM | POA: Diagnosis not present

## 2023-09-17 DIAGNOSIS — H353212 Exudative age-related macular degeneration, right eye, with inactive choroidal neovascularization: Secondary | ICD-10-CM | POA: Diagnosis not present

## 2023-09-17 DIAGNOSIS — H401123 Primary open-angle glaucoma, left eye, severe stage: Secondary | ICD-10-CM | POA: Diagnosis not present

## 2023-09-17 DIAGNOSIS — H353113 Nonexudative age-related macular degeneration, right eye, advanced atrophic without subfoveal involvement: Secondary | ICD-10-CM | POA: Diagnosis not present

## 2023-10-01 DIAGNOSIS — Z853 Personal history of malignant neoplasm of breast: Secondary | ICD-10-CM | POA: Diagnosis not present

## 2023-10-01 DIAGNOSIS — E1151 Type 2 diabetes mellitus with diabetic peripheral angiopathy without gangrene: Secondary | ICD-10-CM | POA: Diagnosis not present

## 2023-10-01 DIAGNOSIS — N182 Chronic kidney disease, stage 2 (mild): Secondary | ICD-10-CM | POA: Diagnosis not present

## 2023-10-05 DIAGNOSIS — H401113 Primary open-angle glaucoma, right eye, severe stage: Secondary | ICD-10-CM | POA: Diagnosis not present

## 2023-10-05 DIAGNOSIS — H401123 Primary open-angle glaucoma, left eye, severe stage: Secondary | ICD-10-CM | POA: Diagnosis not present

## 2023-10-11 IMAGING — MG MM DIGITAL SCREENING BILAT W/ TOMO AND CAD
8 series · 9 of 24 positions shown · non-contrast
Comparison: Previous exam(s).

CLINICAL DATA: Screening.

EXAM:
DIGITAL SCREENING BILATERAL MAMMOGRAM WITH TOMOSYNTHESIS AND CAD
TECHNIQUE: Bilateral screening digital craniocaudal and mediolateral oblique
mammograms were obtained. Bilateral screening digital breast
tomosynthesis was performed. The images were evaluated with
computer-aided detection.

[R MLO synth-2D]
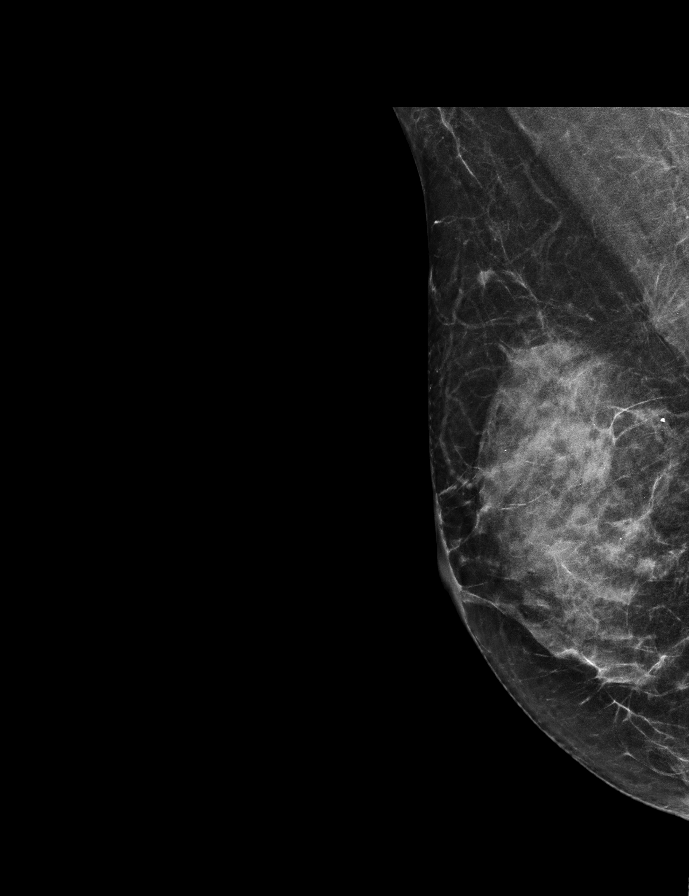

[R CC synth-2D]
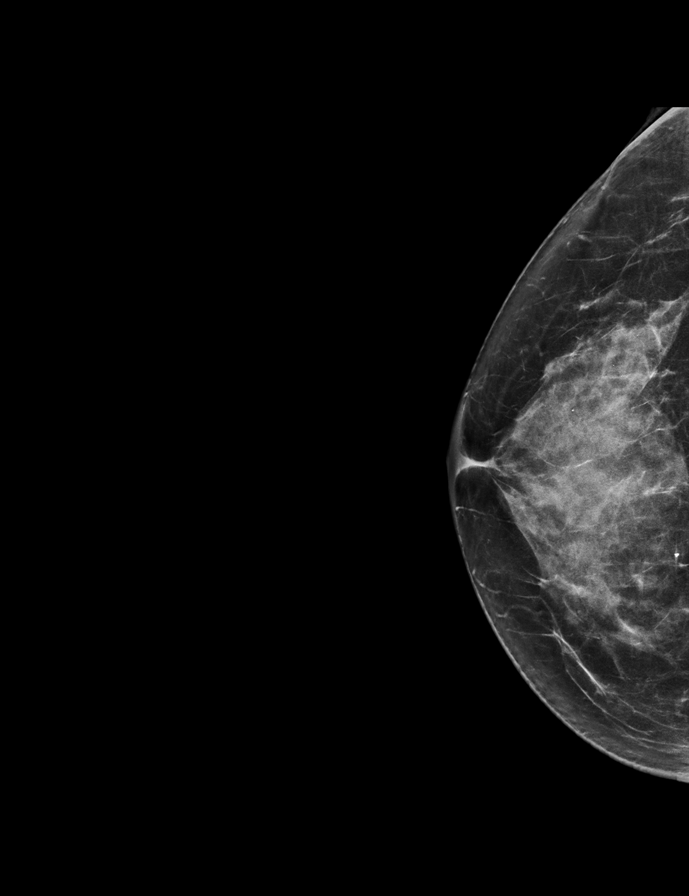

[L MLO synth-2D]
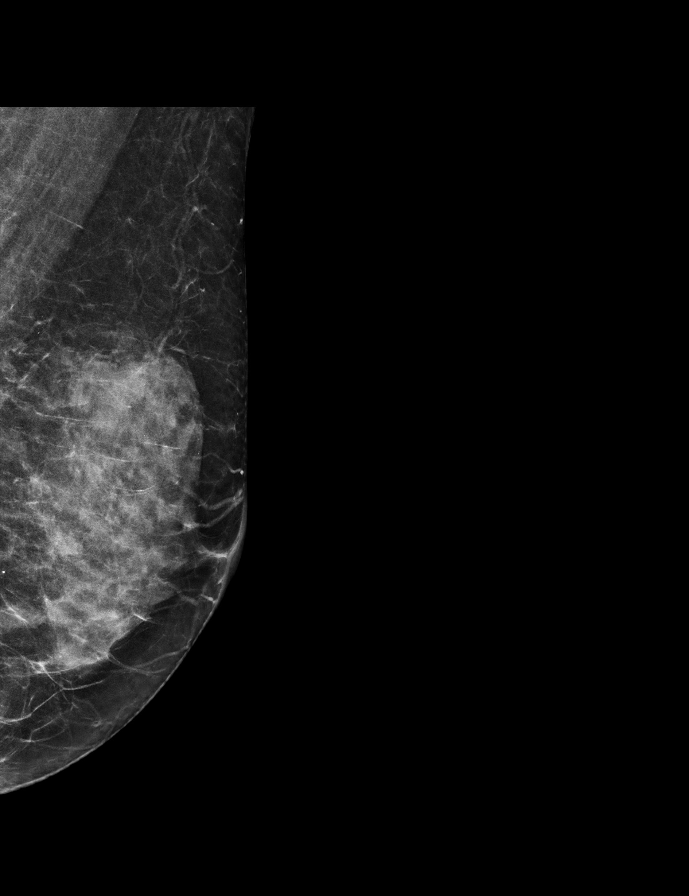

[L CC synth-2D]
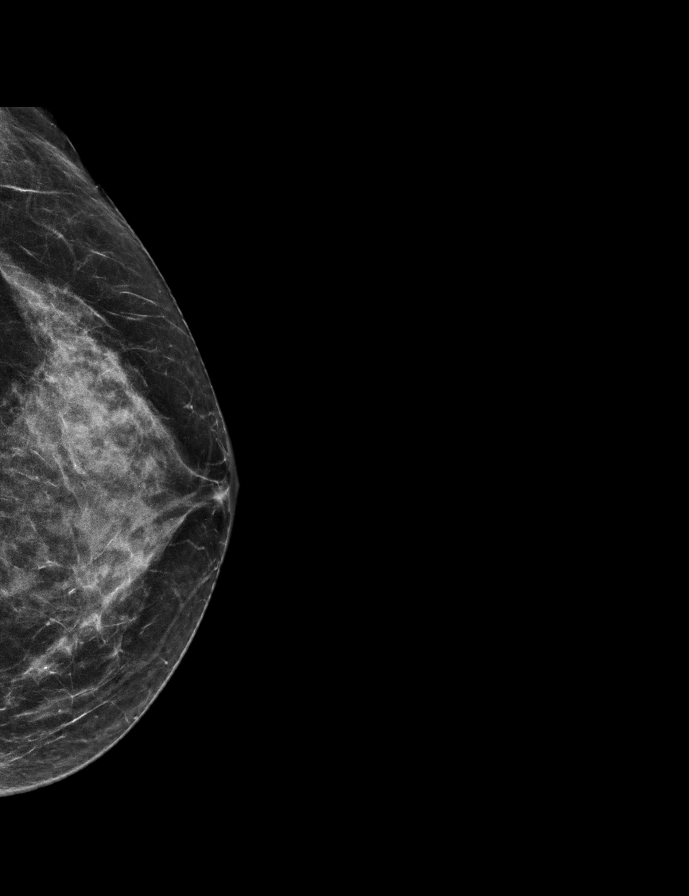

[L MLO tomo · 2 of 55 frames shown]
[frame 18/55]
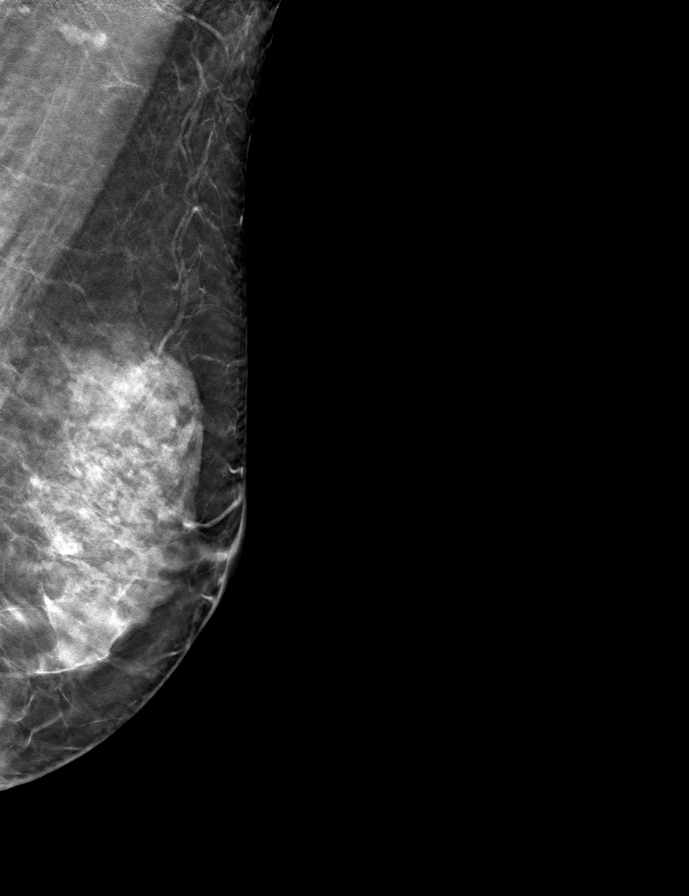
[frame 28/55]
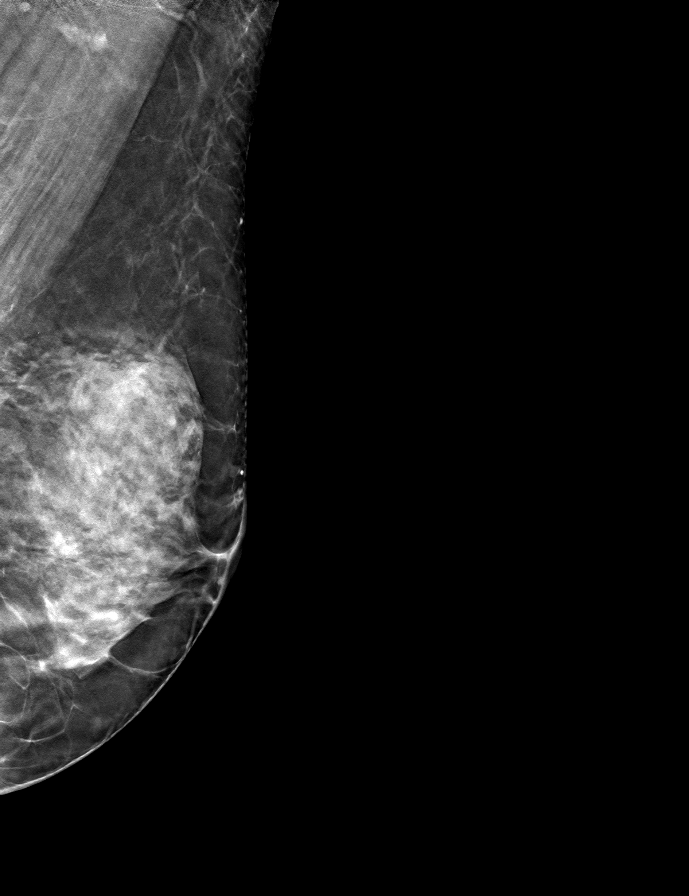

[R MLO tomo · tomo slice 28/55.0]
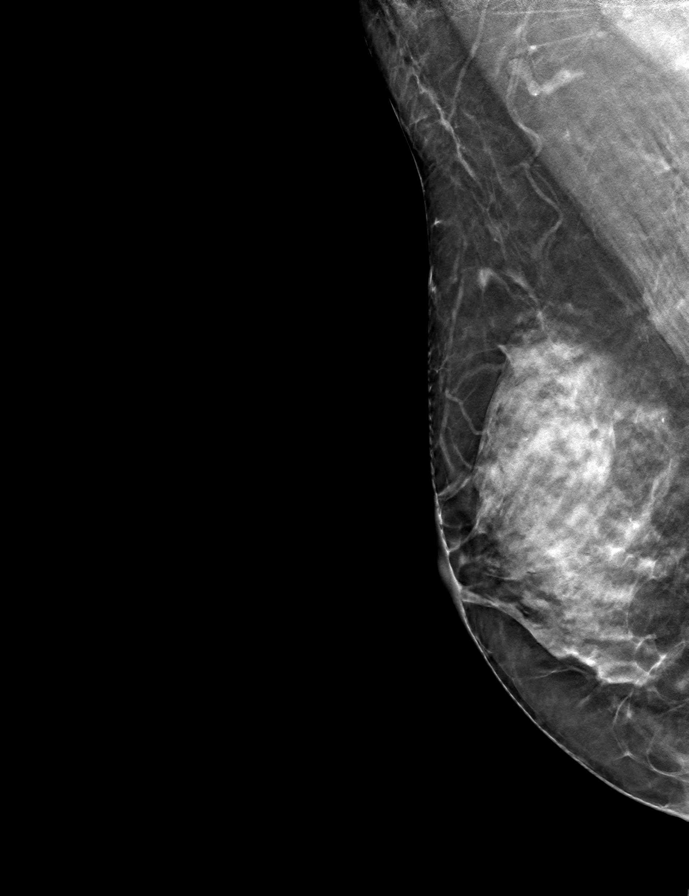

[R CC tomo · tomo slice 31/62.0]
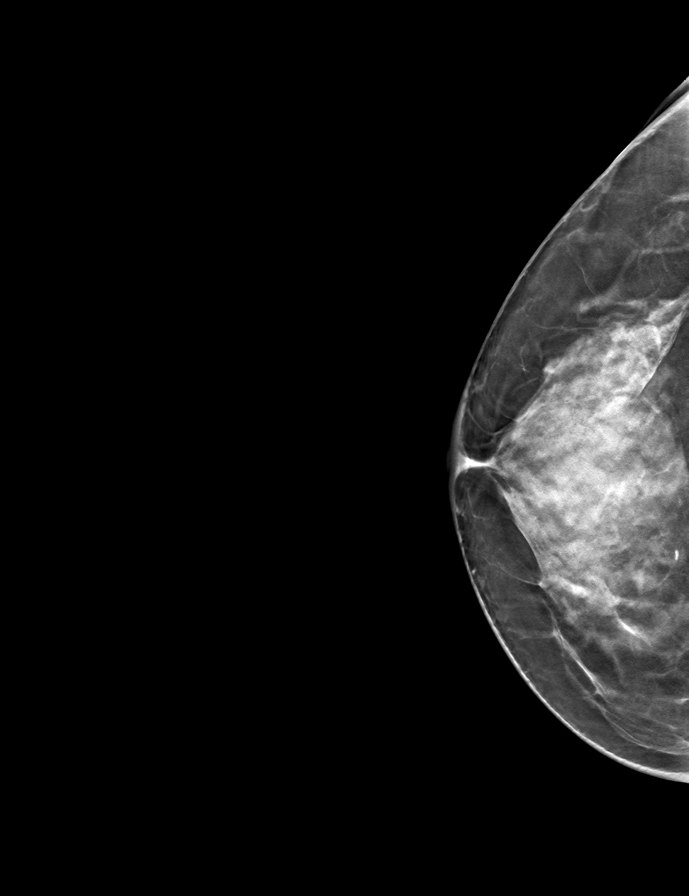

[L CC tomo · tomo slice 28/55.0]
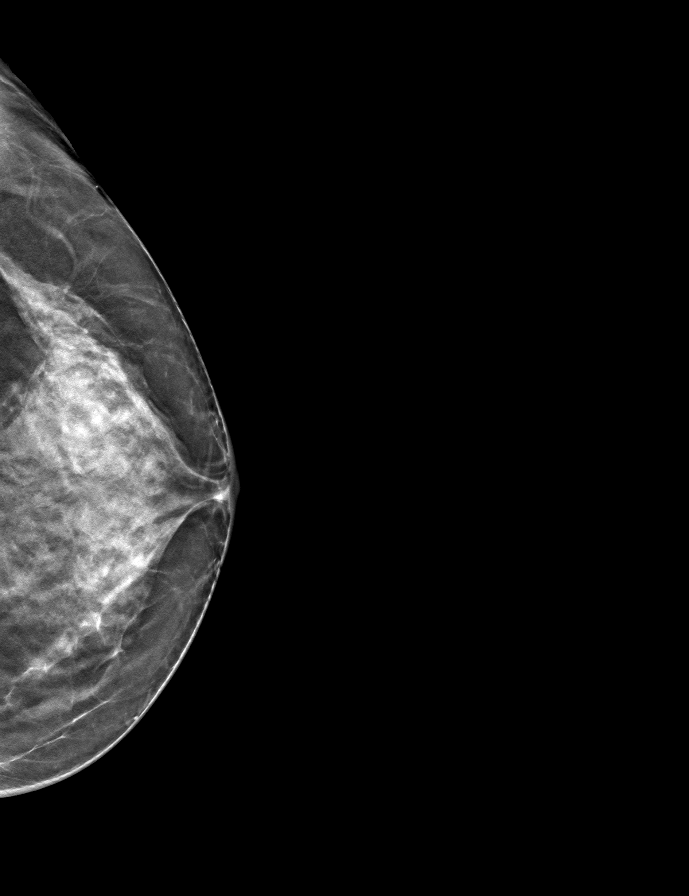

[9 of 24 positions shown; findings below may reference images not displayed]

ACR Breast Density Category c: The breast tissue is heterogeneously
dense, which may obscure small masses.
FINDINGS: There are no findings suspicious for malignancy.
IMPRESSION: No mammographic evidence of malignancy. A result letter of this
screening mammogram will be mailed directly to the patient.

RECOMMENDATION:
Screening mammogram in one year. (Code:[id number])

BI-RADS CATEGORY  1: Negative.

## 2023-10-20 DIAGNOSIS — H348122 Central retinal vein occlusion, left eye, stable: Secondary | ICD-10-CM | POA: Diagnosis not present

## 2023-10-20 DIAGNOSIS — H353212 Exudative age-related macular degeneration, right eye, with inactive choroidal neovascularization: Secondary | ICD-10-CM | POA: Diagnosis not present

## 2023-10-20 DIAGNOSIS — H348121 Central retinal vein occlusion, left eye, with retinal neovascularization: Secondary | ICD-10-CM | POA: Diagnosis not present

## 2023-10-20 DIAGNOSIS — H401123 Primary open-angle glaucoma, left eye, severe stage: Secondary | ICD-10-CM | POA: Diagnosis not present

## 2023-10-28 DIAGNOSIS — H401123 Primary open-angle glaucoma, left eye, severe stage: Secondary | ICD-10-CM | POA: Diagnosis not present

## 2023-10-28 DIAGNOSIS — H5712 Ocular pain, left eye: Secondary | ICD-10-CM | POA: Diagnosis not present

## 2023-10-28 DIAGNOSIS — H353124 Nonexudative age-related macular degeneration, left eye, advanced atrophic with subfoveal involvement: Secondary | ICD-10-CM | POA: Diagnosis not present

## 2023-10-28 DIAGNOSIS — H348121 Central retinal vein occlusion, left eye, with retinal neovascularization: Secondary | ICD-10-CM | POA: Diagnosis not present

## 2023-10-28 DIAGNOSIS — H353113 Nonexudative age-related macular degeneration, right eye, advanced atrophic without subfoveal involvement: Secondary | ICD-10-CM | POA: Diagnosis not present

## 2023-10-28 DIAGNOSIS — H353212 Exudative age-related macular degeneration, right eye, with inactive choroidal neovascularization: Secondary | ICD-10-CM | POA: Diagnosis not present

## 2023-10-28 DIAGNOSIS — H348122 Central retinal vein occlusion, left eye, stable: Secondary | ICD-10-CM | POA: Diagnosis not present

## 2023-11-09 DIAGNOSIS — Z853 Personal history of malignant neoplasm of breast: Secondary | ICD-10-CM | POA: Diagnosis not present

## 2023-11-09 DIAGNOSIS — I272 Pulmonary hypertension, unspecified: Secondary | ICD-10-CM | POA: Diagnosis not present

## 2023-11-09 DIAGNOSIS — I10 Essential (primary) hypertension: Secondary | ICD-10-CM | POA: Diagnosis not present

## 2023-11-09 DIAGNOSIS — H353210 Exudative age-related macular degeneration, right eye, stage unspecified: Secondary | ICD-10-CM | POA: Diagnosis not present

## 2023-11-09 DIAGNOSIS — E782 Mixed hyperlipidemia: Secondary | ICD-10-CM | POA: Diagnosis not present

## 2023-11-09 DIAGNOSIS — M810 Age-related osteoporosis without current pathological fracture: Secondary | ICD-10-CM | POA: Diagnosis not present

## 2023-11-09 DIAGNOSIS — I25118 Atherosclerotic heart disease of native coronary artery with other forms of angina pectoris: Secondary | ICD-10-CM | POA: Diagnosis not present

## 2023-11-09 DIAGNOSIS — E1122 Type 2 diabetes mellitus with diabetic chronic kidney disease: Secondary | ICD-10-CM | POA: Diagnosis not present

## 2023-11-09 DIAGNOSIS — N182 Chronic kidney disease, stage 2 (mild): Secondary | ICD-10-CM | POA: Diagnosis not present

## 2023-11-11 ENCOUNTER — Encounter: Payer: Self-pay | Admitting: *Deleted

## 2023-11-13 ENCOUNTER — Ambulatory Visit: Admitting: Cardiology

## 2023-11-18 DIAGNOSIS — I10 Essential (primary) hypertension: Secondary | ICD-10-CM | POA: Diagnosis not present

## 2023-11-19 DIAGNOSIS — R051 Acute cough: Secondary | ICD-10-CM | POA: Diagnosis not present

## 2023-11-19 DIAGNOSIS — R0981 Nasal congestion: Secondary | ICD-10-CM | POA: Diagnosis not present

## 2023-11-23 ENCOUNTER — Ambulatory Visit: Attending: Physician Assistant | Admitting: Physician Assistant

## 2023-11-23 ENCOUNTER — Encounter: Payer: Self-pay | Admitting: Physician Assistant

## 2023-11-23 VITALS — BP 142/60 | HR 84 | Ht 62.0 in | Wt 107.4 lb

## 2023-11-23 DIAGNOSIS — I25118 Atherosclerotic heart disease of native coronary artery with other forms of angina pectoris: Secondary | ICD-10-CM

## 2023-11-23 DIAGNOSIS — E782 Mixed hyperlipidemia: Secondary | ICD-10-CM

## 2023-11-23 DIAGNOSIS — I10 Essential (primary) hypertension: Secondary | ICD-10-CM

## 2023-11-23 MED ORDER — ROSUVASTATIN CALCIUM 40 MG PO TABS: 40.0000 mg | ORAL_TABLET | Freq: Every day | ORAL | 3 refills | Status: AC

## 2023-11-23 NOTE — Patient Instructions (Signed)
 Medication Instructions:  Stop Diovan Increase Crestor  40 mg daily  *If you need a refill on your cardiac medications before your next appointment, please call your pharmacy*  Lab Work: Fasting Lipid pane & A1C in 3 months  Testing/Procedures: NONE ordered at this time of appointment   Follow-Up: At [facility], you and your health needs are our priority.  As part of our continuing mission to provide you with exceptional heart care, our providers are all part of one team.  This team includes your primary Cardiologist (physician) and Advanced Practice Providers or APPs (Physician Assistants and Nurse Practitioners) who all work together to provide you with the care you need, when you need it.  Your next appointment:   6 month(s)  Provider:   None or Orren Fabry, PA-C          We recommend signing up for the patient portal called MyChart.  Sign up information is provided on this After Visit Summary.  MyChart is used to connect with patients for Virtual Visits (Telemedicine).  Patients are able to view lab/test results, encounter notes, upcoming appointments, etc.  Non-urgent messages can be sent to your provider as well.   To learn more about what you can do with MyChart, go to [email or web address].

## 2023-11-23 NOTE — Progress Notes (Signed)
 Cardiology Office Note   Date:  11/23/2023  ID:  TANIA Mercado, DOB 27-Dec-1940, MRN [id number] PCP: Ransom Other, MD  [facility] Providers Cardiologist:  Gordy Bergamo, MD    History of Present Illness Tami Mercado is a 83 y.o. female with a past medical history significant for hypertension, hyperlipidemia, and diabetes who was seen in the office May 2024 for hypertension with exertional chest pain suggestive of angina and underwent coronary angiography for abnormal stress test April of last year and was recommended CABG.  Presented now for annual follow-up.  Last year patient had not resumed walking but she had returned to her normal exercise routine including yoga and kickboxing.  She did not have any chest pain or dyspnea.  She is tolerating all medications well.  Evaluated by CT surgery and recommended further discussion of management of CAD.  Patient is wondering whether she should have surgery or continue medical therapy.  Today, she presents with hypertension and coronary artery disease with concerns about medication management and high blood pressure.  She experiences confusion with her blood pressure medication regimen, which includes amlodipine, hydralazine , isosorbide  mononitrate, metoprolol  succinate, Diovan HCT, and possibly telmisartan. Her primary care records indicate telmisartan may be her only blood pressure medication.  She has significant coronary artery disease with a 95% blockage in a branch off the LAD and a 99% blockage higher up on the LAD. She has chosen not to pursue surgical intervention. Her LDL is 145 and triglycerides are 298, managed with rosuvastatin  20 mg daily.  She has a history of moderate mitral regurgitation and mild tricuspid regurgitation. She experiences occasional dyspnea.  Her diabetes is managed with metformin 500 mg daily, with a recent A1c of 7.1. Her family history includes a mother with high cholesterol despite a vegetarian diet. She  attempts to balance her cultural dietary preferences with health recommendations.  Reports no chest pain, pressure, or tightness. No edema, orthopnea, PND. Reports no palpitations.   Discussed the use of AI scribe software for clinical note transcription with the patient, who gave verbal consent to proceed.   ROS: Pertinent ROS in HPI.  Studies Reviewed      Left Heart Catheterization 06/20/22:  LV: 159/1, EDP 11 mmHg.  Ao 158/60, mean 98 mmHg.  No pressure gradient across aortic valve.   LM: It is a large-caliber vessel proximally, distal left main has a 30 to 40% stenosis. LAD: Very large vessel, complex proximal diffuse mild to moderately calcific high-grade stenosis in the ostium extending to the proximal segment of the LAD with TIMI II flow into the LAD.  Gives origin to a very large D1 with a high-grade 95% stenosis in the proximal segment. RI: Large vessel, very mild disease noted. LCx: Moderate caliber vessel.  Gives origin to 3 obtuse marginals.  Mild disease is present. RCA: Large-caliber vessel and a dominant vessel.  Mild disease is noted.      Impression: Extremely complex and high-grade stenosis involving the ostial LAD with TIMI II flow through the LAD and a high-grade D1 stenosis, early bifurcation, with a proximal 95% stenosis with TIMI-3 flow.   Recommendation: Patient needs urgent evaluation for CABG.  Patient is aware not to do any heavy exertional activity.  She is also not taking aspirin  which will be restarted today.  Will increase metoprolol  succinate to 50 mg daily, continue statins.  Outpatient evaluation will be urgently set up.  Patient has no symptoms of unstable angina, continues to exercise on a daily  basis, I have advised her not to exercise until full complete evaluation is being performed.    Gordy Bergamo, MD, [facility] 06/20/2022, 9:40 AM Office: [phone] Fax: [phone] Pager: [phone]       Physical Exam VS:  BP (!) 142/60   Pulse 84   Ht 5'  2 (1.575 m)   Wt 107 lb 6.4 oz (48.7 kg)   SpO2 96%   BMI 19.64 kg/m        Wt Readings from Last 3 Encounters:  11/23/23 107 lb 6.4 oz (48.7 kg)  03/30/23 110 lb (49.9 kg)  10/22/22 108 lb (49 kg)    GEN: Well nourished, well developed in no acute distress NECK: No JVD; No carotid bruits CARDIAC: RRR, no murmurs, rubs, gallops RESPIRATORY:  Clear to auscultation without rales, wheezing or rhonchi  ABDOMEN: Soft, non-tender, non-distended EXTREMITIES:  No edema; No deformity   ASSESSMENT AND PLAN  Coronary artery disease -New T wave inversions on EKG with known CAD and deferral of surgery -Continue current medical regimen with isosorbide  mononitrate - Luckily, currently no chest pain or shortness of breath - Patient and family prefers medical management  Hypertension -Blood pressure better controlled today  - Continue current medication regimen including amlodipine 10 mg daily, isosorbide  mononitrate 30 mg, metoprolol  succinate 50 mg daily, telmisartan 40 mg daily  Hyperlipidemia -Today we ordered updated lipid panel and hemoglobin A1c -We discussed some dietary changes -LDL and triglycerides remain above goal -Increase Crestor  to 40 mg daily and recheck lipids     Dispo: She can follow-up in 6 months with MD  Signed, Orren LOISE Fabry, PA-C

## 2023-12-01 DIAGNOSIS — L853 Xerosis cutis: Secondary | ICD-10-CM | POA: Diagnosis not present

## 2023-12-01 DIAGNOSIS — I10 Essential (primary) hypertension: Secondary | ICD-10-CM | POA: Diagnosis not present

## 2023-12-01 DIAGNOSIS — R059 Cough, unspecified: Secondary | ICD-10-CM | POA: Diagnosis not present

## 2023-12-05 ENCOUNTER — Emergency Department (HOSPITAL_COMMUNITY): Admission: EM | Admit: 2023-12-05 | Discharge: 2023-12-05 | Disposition: A | Discharge status: Home / Self Care

## 2023-12-05 ENCOUNTER — Other Ambulatory Visit: Payer: Self-pay

## 2023-12-05 ENCOUNTER — Emergency Department (HOSPITAL_COMMUNITY)

## 2023-12-05 DIAGNOSIS — Z7984 Long term (current) use of oral hypoglycemic drugs: Secondary | ICD-10-CM | POA: Insufficient documentation

## 2023-12-05 DIAGNOSIS — Z7982 Long term (current) use of aspirin: Secondary | ICD-10-CM | POA: Insufficient documentation

## 2023-12-05 DIAGNOSIS — R21 Rash and other nonspecific skin eruption: Secondary | ICD-10-CM | POA: Insufficient documentation

## 2023-12-05 DIAGNOSIS — R059 Cough, unspecified: Secondary | ICD-10-CM | POA: Diagnosis not present

## 2023-12-05 DIAGNOSIS — Z79899 Other long term (current) drug therapy: Secondary | ICD-10-CM | POA: Diagnosis not present

## 2023-12-05 DIAGNOSIS — R051 Acute cough: Secondary | ICD-10-CM | POA: Diagnosis not present

## 2023-12-05 DIAGNOSIS — I771 Stricture of artery: Secondary | ICD-10-CM | POA: Diagnosis not present

## 2023-12-05 DIAGNOSIS — I10 Essential (primary) hypertension: Secondary | ICD-10-CM | POA: Insufficient documentation

## 2023-12-05 DIAGNOSIS — I700 Atherosclerosis of aorta: Secondary | ICD-10-CM | POA: Diagnosis not present

## 2023-12-05 LAB — RESP PANEL BY RT-PCR (RSV, FLU A&B, COVID)  RVPGX2
Influenza A by PCR: NEGATIVE
Influenza B by PCR: NEGATIVE
Resp Syncytial Virus by PCR: NEGATIVE
SARS Coronavirus 2 by RT PCR: NEGATIVE

## 2023-12-05 MED ORDER — PREDNISONE 10 MG PO TABS: 20.0000 mg | ORAL_TABLET | Freq: Every day | ORAL | 0 refills | Status: AC

## 2023-12-05 MED ORDER — PROMETHAZINE-DM 6.25-15 MG/5ML PO SYRP: 5.0000 mL | ORAL_SOLUTION | Freq: Four times a day (QID) | ORAL | 0 refills | Status: AC | PRN

## 2023-12-05 NOTE — ED Triage Notes (Signed)
 Patient to ED by POV with c/o URI. She reports felling bad, productive cough and rash to chest. She has had symptoms for 3 weeks and was seen by her PCP and told nothing was wrong with her.

## 2023-12-05 NOTE — ED Provider Notes (Signed)
 [place] [facility]ENC[facility] DEPARTMENT AT [facility] Provider Note   CSN: [id number] Arrival date & time: 12/05/23  1329     Patient presents with: URI and Rash   Tami Mercado is a 83 y.o. female.  {Add pertinent medical, surgical, social history, OB history to HPI:32947}  URI Rash      Prior to Admission medications   Medication Sig Start Date End Date Taking? Authorizing Provider  acetaminophen  (TYLENOL ) 500 MG tablet Take 1 tablet (500 mg total) by mouth every 6 (six) hours as needed. 03/30/23   Nivia Colon, PA-C  amLODipine (NORVASC) 10 MG tablet Take 10 mg by mouth at bedtime.    [provider]  Apoaequorin (PREVAGEN PO) Take 1 tablet by mouth daily.    [provider]  aspirin  (ASPIRIN  CHILDRENS) 81 MG chewable tablet Chew 1 tablet (81 mg total) by mouth daily. 06/20/22   Ladona Heinz, MD  brimonidine (ALPHAGAN P) 0.1 % SOLN Place 1 drop into the left eye in the morning and at bedtime.    [provider]  clopidogrel  (PLAVIX ) 75 MG tablet Take 1 tablet (75 mg total) by mouth daily. 07/10/23   Ladona Heinz, MD  cyclobenzaprine  (FLEXERIL ) 10 MG tablet Take 1 tablet (10 mg total) by mouth 2 (two) times daily as needed for muscle spasms. 03/30/23   Nivia Colon, PA-C  dorzolamide-timolol (COSOPT) 2-0.5 % ophthalmic solution Place 1 drop into both eyes 2 (two) times daily. 07/12/22   [provider]  erythromycin  ophthalmic ointment Place a 1/2 inch ribbon of ointment into the lower eyelid. 07/20/22   Roemhildt, Lorin T, PA-C  escitalopram (LEXAPRO) 5 MG tablet Take 2.5 mg by mouth daily. 08/26/21   [provider]  fenofibrate (TRICOR) 145 MG tablet Take 145 mg by mouth daily.    [provider]  isosorbide  mononitrate (IMDUR ) 60 MG 24 hr tablet Take 0.5 tablets (30 mg total) by mouth daily. 01/08/23   Ladona Heinz, MD  metFORMIN (GLUCOPHAGE) 500 MG tablet Take 500 mg by mouth daily. 11/22/19   [provider]  metoprolol   succinate (TOPROL -XL) 50 MG 24 hr tablet Take 1 tablet (50 mg total) by mouth at bedtime. 06/20/22   Ladona Heinz, MD  Multiple Vitamins-Minerals (PRESERVISION AREDS 2 PO) Take 2 capsules by mouth daily.    [provider]  nitroGLYCERIN  (NITROSTAT ) 0.4 MG SL tablet Place 1 tablet (0.4 mg total) under the tongue every 5 (five) minutes as needed for up to 25 days for chest pain. 05/23/22 11/23/23  Custovic, Sabina, DO  pantoprazole (PROTONIX) 40 MG tablet Take 40 mg by mouth daily.    [provider]  rosuvastatin  (CRESTOR ) 40 MG tablet Take 1 tablet (40 mg total) by mouth daily. 11/23/23 02/21/24  Lucien Orren SAILOR, PA-C  spironolactone (ALDACTONE) 25 MG tablet Take 25 mg by mouth daily.    [provider]  telmisartan (MICARDIS) 40 MG tablet Take 40 mg by mouth daily.    [provider]    Allergies: Alendronate, Amoxicillin, Dicyclomine hcl, and Lisinopril    Review of Systems  Skin:  Positive for rash.    Updated Vital Signs BP (!) 191/79 (BP Location: Left Arm) Comment: PT HAS HISTORY Of hypertension.  DId not take meds for 2 days.  Pulse 83   Temp 98 F (36.7 C) (Oral)   Resp 16   Ht 5' 2 (1.575 m)   Wt 49.9 kg   SpO2 97%   BMI 20.12 kg/m  Physical Exam  (all labs ordered are listed, but only abnormal results are displayed) Labs Reviewed  RESP PANEL BY RT-PCR (RSV, FLU A&B, COVID)  [id number]    EKG: None  Radiology: DG Chest 2 View Result Date: 12/05/2023 CLINICAL DATA:  cough EXAM: CHEST - 2 VIEW COMPARISON:  None available. FINDINGS: Calcified granuloma in the right lower lobe. No focal airspace consolidation, pleural effusion, or pneumothorax. No cardiomegaly. Tortuous aorta with aortic atherosclerosis. No acute fracture or destructive lesions. Multilevel thoracic osteophytosis. Osteopenia. IMPRESSION: No acute cardiopulmonary abnormality. Electronically Signed   By: Rogelia Myers M.D.   On: 12/05/2023 14:55    {Document cardiac monitor,  telemetry assessment procedure when appropriate:32947} Procedures   Medications Ordered in the ED - No data to display    {Click here for ABCD2, HEART and other calculators REFRESH Note before signing:1}                              Medical Decision Making Amount and/or Complexity of Data Reviewed Radiology: ordered.   ***  {Document critical care time when appropriate  Document review of labs and clinical decision tools ie CHADS2VASC2, etc  Document your independent review of radiology images and any outside records  Document your discussion with family members, caretakers and with consultants  Document social determinants of health affecting pt's care  Document your decision making why or why not admission, treatments were needed:32947:::1}   Final diagnoses:  None    ED Discharge Orders     None

## 2023-12-05 NOTE — Discharge Instructions (Addendum)
 Your x-ray does not show any pneumonia.  Your workup today was quite reassuring I recommend warm tea with honey. I have also prescribed you a different cough syrup that you can use at bedtime however I recommend using this only sparingly and as needed.

## 2023-12-06 DIAGNOSIS — R051 Acute cough: Secondary | ICD-10-CM | POA: Diagnosis not present

## 2023-12-09 DIAGNOSIS — H5712 Ocular pain, left eye: Secondary | ICD-10-CM | POA: Diagnosis not present

## 2023-12-09 DIAGNOSIS — H401123 Primary open-angle glaucoma, left eye, severe stage: Secondary | ICD-10-CM | POA: Diagnosis not present

## 2023-12-09 DIAGNOSIS — H348121 Central retinal vein occlusion, left eye, with retinal neovascularization: Secondary | ICD-10-CM | POA: Diagnosis not present

## 2023-12-09 DIAGNOSIS — H353124 Nonexudative age-related macular degeneration, left eye, advanced atrophic with subfoveal involvement: Secondary | ICD-10-CM | POA: Diagnosis not present

## 2023-12-09 DIAGNOSIS — H348122 Central retinal vein occlusion, left eye, stable: Secondary | ICD-10-CM | POA: Diagnosis not present

## 2023-12-09 DIAGNOSIS — H353113 Nonexudative age-related macular degeneration, right eye, advanced atrophic without subfoveal involvement: Secondary | ICD-10-CM | POA: Diagnosis not present

## 2023-12-11 DIAGNOSIS — I10 Essential (primary) hypertension: Secondary | ICD-10-CM | POA: Diagnosis not present

## 2023-12-11 DIAGNOSIS — Z23 Encounter for immunization: Secondary | ICD-10-CM | POA: Diagnosis not present

## 2023-12-21 NOTE — Progress Notes (Signed)
 Tami Mercado                                          MRN: [id number]   12/21/2023   The [facility] Quality Team Specialist reviewed this patient medical record for the purposes of chart review for care gap closure. The following were reviewed: chart review for care gap closure-kidney health evaluation for diabetes:eGFR  and uACR.    [facility]I Quality Team

## 2024-01-13 NOTE — Progress Notes (Signed)
 Tami Mercado                                          MRN: [id number]   01/13/2024   The [facility] Quality Team Specialist reviewed this patient medical record for the purposes of chart review for care gap closure. The following were reviewed: chart review for care gap closure-kidney health evaluation for diabetes:eGFR  and uACR.    [facility]BCI Quality Team

## 2024-01-22 ENCOUNTER — Other Ambulatory Visit: Payer: Self-pay | Admitting: Internal Medicine

## 2024-01-22 DIAGNOSIS — Z1231 Encounter for screening mammogram for malignant neoplasm of breast: Secondary | ICD-10-CM

## 2024-01-27 ENCOUNTER — Ambulatory Visit
Admission: RE | Admit: 2024-01-27 | Discharge: 2024-01-27 | Disposition: A | Discharge status: Home / Self Care | Source: Ambulatory Visit | Attending: Internal Medicine | Admitting: Internal Medicine

## 2024-01-27 DIAGNOSIS — Z1231 Encounter for screening mammogram for malignant neoplasm of breast: Secondary | ICD-10-CM

## 2024-01-29 ENCOUNTER — Other Ambulatory Visit: Payer: Self-pay | Admitting: Cardiology

## 2024-01-29 DIAGNOSIS — I25118 Atherosclerotic heart disease of native coronary artery with other forms of angina pectoris: Secondary | ICD-10-CM

## 2024-02-10 ENCOUNTER — Ambulatory Visit: Admitting: Cardiology

## 2024-05-26 ENCOUNTER — Ambulatory Visit: Admitting: Cardiology
